# Patient Record
Sex: Male | Born: 1950
Health system: Southern US, Community
[De-identification: ages and names within clinical notes are randomized; demographics above are authoritative.]

## PROBLEM LIST (undated history)

## (undated) DIAGNOSIS — E785 Hyperlipidemia, unspecified: Secondary | ICD-10-CM

## (undated) DIAGNOSIS — R9439 Abnormal result of other cardiovascular function study: Secondary | ICD-10-CM

## (undated) DIAGNOSIS — Z720 Tobacco use: Secondary | ICD-10-CM

## (undated) DIAGNOSIS — I10 Essential (primary) hypertension: Secondary | ICD-10-CM

## (undated) DIAGNOSIS — I208 Other forms of angina pectoris: Secondary | ICD-10-CM

## (undated) DIAGNOSIS — I499 Cardiac arrhythmia, unspecified: Secondary | ICD-10-CM

## (undated) DIAGNOSIS — F17200 Nicotine dependence, unspecified, uncomplicated: Secondary | ICD-10-CM

## (undated) DIAGNOSIS — F32A Depression, unspecified: Secondary | ICD-10-CM

## (undated) DIAGNOSIS — I2510 Atherosclerotic heart disease of native coronary artery without angina pectoris: Secondary | ICD-10-CM

## (undated) DIAGNOSIS — F329 Major depressive disorder, single episode, unspecified: Secondary | ICD-10-CM

## (undated) DIAGNOSIS — M1990 Unspecified osteoarthritis, unspecified site: Secondary | ICD-10-CM

## (undated) HISTORY — PX: KNEE ARTHROSCOPY W/ DEBRIDEMENT: SHX1867

## (undated) HISTORY — PX: CERVICAL DISC SURGERY: SHX588

## (undated) HISTORY — PX: CARDIAC CATHETERIZATION: SHX172

---

## 2000-09-03 ENCOUNTER — Ambulatory Visit (HOSPITAL_COMMUNITY): Admission: RE | Admit: 2000-09-03 | Discharge: 2000-09-03 | Payer: Self-pay | Admitting: Family Medicine

## 2000-09-03 ENCOUNTER — Encounter: Payer: Self-pay | Admitting: Family Medicine

## 2002-08-22 ENCOUNTER — Encounter: Payer: Self-pay | Admitting: Family Medicine

## 2002-08-22 ENCOUNTER — Ambulatory Visit (HOSPITAL_COMMUNITY): Admission: RE | Admit: 2002-08-22 | Discharge: 2002-08-22 | Payer: Self-pay | Admitting: Family Medicine

## 2004-09-12 ENCOUNTER — Ambulatory Visit (HOSPITAL_COMMUNITY): Admission: RE | Admit: 2004-09-12 | Discharge: 2004-09-12 | Payer: Self-pay | Admitting: Family Medicine

## 2008-11-15 ENCOUNTER — Ambulatory Visit (HOSPITAL_COMMUNITY): Admission: RE | Admit: 2008-11-15 | Discharge: 2008-11-15 | Payer: Self-pay | Admitting: Family Medicine

## 2008-12-11 ENCOUNTER — Ambulatory Visit (HOSPITAL_COMMUNITY): Admission: RE | Admit: 2008-12-11 | Discharge: 2008-12-12 | Payer: Self-pay | Admitting: Neurosurgery

## 2010-05-02 ENCOUNTER — Other Ambulatory Visit (HOSPITAL_COMMUNITY): Payer: Self-pay | Admitting: Family Medicine

## 2010-05-02 DIAGNOSIS — M545 Low back pain: Secondary | ICD-10-CM

## 2010-05-06 ENCOUNTER — Ambulatory Visit (HOSPITAL_COMMUNITY)
Admission: RE | Admit: 2010-05-06 | Discharge: 2010-05-06 | Disposition: A | Discharge status: Home / Self Care | Payer: BC Managed Care – PPO | Source: Ambulatory Visit | Attending: Family Medicine | Admitting: Family Medicine

## 2010-05-06 DIAGNOSIS — M545 Low back pain, unspecified: Secondary | ICD-10-CM | POA: Insufficient documentation

## 2010-05-06 DIAGNOSIS — M5380 Other specified dorsopathies, site unspecified: Secondary | ICD-10-CM | POA: Insufficient documentation

## 2010-05-06 DIAGNOSIS — M79609 Pain in unspecified limb: Secondary | ICD-10-CM | POA: Insufficient documentation

## 2010-05-06 DIAGNOSIS — M5126 Other intervertebral disc displacement, lumbar region: Secondary | ICD-10-CM | POA: Insufficient documentation

## 2010-05-22 ENCOUNTER — Other Ambulatory Visit: Payer: Self-pay | Admitting: Neurosurgery

## 2010-05-22 DIAGNOSIS — M549 Dorsalgia, unspecified: Secondary | ICD-10-CM

## 2010-05-22 DIAGNOSIS — M5410 Radiculopathy, site unspecified: Secondary | ICD-10-CM

## 2010-05-26 ENCOUNTER — Ambulatory Visit
Admission: RE | Admit: 2010-05-26 | Discharge: 2010-05-26 | Disposition: A | Discharge status: Home / Self Care | Payer: BC Managed Care – PPO | Source: Ambulatory Visit | Attending: Neurosurgery | Admitting: Neurosurgery

## 2010-05-26 ENCOUNTER — Other Ambulatory Visit: Payer: Self-pay | Admitting: Neurosurgery

## 2010-05-26 DIAGNOSIS — M549 Dorsalgia, unspecified: Secondary | ICD-10-CM

## 2010-05-26 DIAGNOSIS — M5410 Radiculopathy, site unspecified: Secondary | ICD-10-CM

## 2010-06-05 LAB — CBC
HCT: 46.9 % (ref 39.0–52.0)
Hemoglobin: 16 g/dL (ref 13.0–17.0)
MCHC: 34.1 g/dL (ref 30.0–36.0)
MCV: 91.3 fL (ref 78.0–100.0)
Platelets: 250 10*3/uL (ref 150–400)
RBC: 5.13 MIL/uL (ref 4.22–5.81)
RDW: 14 % (ref 11.5–15.5)
WBC: 11.3 10*3/uL — ABNORMAL HIGH (ref 4.0–10.5)

## 2010-06-05 LAB — BASIC METABOLIC PANEL
BUN: 12 mg/dL (ref 6–23)
CO2: 27 mEq/L (ref 19–32)
Calcium: 9.9 mg/dL (ref 8.4–10.5)
Chloride: 108 mEq/L (ref 96–112)
Creatinine, Ser: 0.8 mg/dL (ref 0.4–1.5)
GFR calc Af Amer: >60 mL/min (ref >60)
GFR calc non Af Amer: >60 mL/min (ref >60)
Glucose, Bld: 82 mg/dL (ref 70–99)
Potassium: 4.5 mEq/L (ref 3.5–5.1)
Sodium: 142 mEq/L (ref 135–145)

## 2010-06-16 ENCOUNTER — Other Ambulatory Visit: Payer: Self-pay | Admitting: Neurosurgery

## 2010-06-16 ENCOUNTER — Ambulatory Visit
Admission: RE | Admit: 2010-06-16 | Discharge: 2010-06-16 | Disposition: A | Discharge status: Home / Self Care | Payer: BC Managed Care – PPO | Source: Ambulatory Visit | Attending: Neurosurgery | Admitting: Neurosurgery

## 2010-06-16 DIAGNOSIS — M5410 Radiculopathy, site unspecified: Secondary | ICD-10-CM

## 2010-06-16 DIAGNOSIS — M549 Dorsalgia, unspecified: Secondary | ICD-10-CM

## 2011-05-21 ENCOUNTER — Other Ambulatory Visit: Payer: Self-pay | Admitting: Cardiovascular Disease

## 2011-05-21 ENCOUNTER — Other Ambulatory Visit (HOSPITAL_COMMUNITY): Payer: Self-pay | Admitting: Cardiovascular Disease

## 2011-05-21 ENCOUNTER — Encounter (HOSPITAL_COMMUNITY): Payer: Self-pay | Admitting: Pharmacy Technician

## 2011-05-21 ENCOUNTER — Ambulatory Visit (HOSPITAL_COMMUNITY)
Admission: RE | Admit: 2011-05-21 | Discharge: 2011-05-21 | Disposition: A | Discharge status: Home / Self Care | Payer: Medicaid Other | Source: Ambulatory Visit | Attending: Cardiovascular Disease | Admitting: Cardiovascular Disease

## 2011-05-21 DIAGNOSIS — R9439 Abnormal result of other cardiovascular function study: Secondary | ICD-10-CM | POA: Insufficient documentation

## 2011-05-21 DIAGNOSIS — Z981 Arthrodesis status: Secondary | ICD-10-CM | POA: Insufficient documentation

## 2011-05-21 DIAGNOSIS — Z01811 Encounter for preprocedural respiratory examination: Secondary | ICD-10-CM

## 2011-05-26 ENCOUNTER — Encounter (HOSPITAL_COMMUNITY): Payer: Self-pay | Admitting: Internal Medicine

## 2011-05-26 ENCOUNTER — Encounter (HOSPITAL_COMMUNITY)
Admission: RE | Disposition: A | Discharge status: Home / Self Care | Payer: Self-pay | Source: Ambulatory Visit | Attending: Thoracic Surgery (Cardiothoracic Vascular Surgery)

## 2011-05-26 ENCOUNTER — Other Ambulatory Visit: Payer: Self-pay

## 2011-05-26 ENCOUNTER — Ambulatory Visit (HOSPITAL_COMMUNITY): Payer: Medicaid Other

## 2011-05-26 ENCOUNTER — Encounter (HOSPITAL_COMMUNITY): Payer: Self-pay | Admitting: Anesthesiology

## 2011-05-26 ENCOUNTER — Inpatient Hospital Stay (HOSPITAL_COMMUNITY)
Admission: RE | Admit: 2011-05-26 | Discharge: 2011-05-30 | DRG: 234 | Disposition: A | Discharge status: Home / Self Care | Payer: Medicaid Other | Source: Ambulatory Visit | Attending: Thoracic Surgery (Cardiothoracic Vascular Surgery) | Admitting: Thoracic Surgery (Cardiothoracic Vascular Surgery)

## 2011-05-26 DIAGNOSIS — Z720 Tobacco use: Secondary | ICD-10-CM | POA: Diagnosis present

## 2011-05-26 DIAGNOSIS — R9439 Abnormal result of other cardiovascular function study: Secondary | ICD-10-CM | POA: Diagnosis present

## 2011-05-26 DIAGNOSIS — I10 Essential (primary) hypertension: Secondary | ICD-10-CM | POA: Diagnosis present

## 2011-05-26 DIAGNOSIS — I2510 Atherosclerotic heart disease of native coronary artery without angina pectoris: Secondary | ICD-10-CM

## 2011-05-26 DIAGNOSIS — Z01810 Encounter for preprocedural cardiovascular examination: Secondary | ICD-10-CM

## 2011-05-26 DIAGNOSIS — E785 Hyperlipidemia, unspecified: Secondary | ICD-10-CM | POA: Diagnosis present

## 2011-05-26 DIAGNOSIS — I2089 Other forms of angina pectoris: Secondary | ICD-10-CM | POA: Diagnosis present

## 2011-05-26 DIAGNOSIS — M5137 Other intervertebral disc degeneration, lumbosacral region: Secondary | ICD-10-CM | POA: Diagnosis present

## 2011-05-26 DIAGNOSIS — M129 Arthropathy, unspecified: Secondary | ICD-10-CM | POA: Diagnosis present

## 2011-05-26 DIAGNOSIS — I208 Other forms of angina pectoris: Secondary | ICD-10-CM | POA: Diagnosis present

## 2011-05-26 DIAGNOSIS — M51379 Other intervertebral disc degeneration, lumbosacral region without mention of lumbar back pain or lower extremity pain: Secondary | ICD-10-CM | POA: Diagnosis present

## 2011-05-26 DIAGNOSIS — Z23 Encounter for immunization: Secondary | ICD-10-CM

## 2011-05-26 DIAGNOSIS — Z7982 Long term (current) use of aspirin: Secondary | ICD-10-CM

## 2011-05-26 DIAGNOSIS — F17200 Nicotine dependence, unspecified, uncomplicated: Secondary | ICD-10-CM | POA: Diagnosis present

## 2011-05-26 HISTORY — DX: Atherosclerotic heart disease of native coronary artery without angina pectoris: I25.10

## 2011-05-26 HISTORY — DX: Essential (primary) hypertension: I10

## 2011-05-26 HISTORY — DX: Tobacco use: Z72.0

## 2011-05-26 HISTORY — DX: Unspecified osteoarthritis, unspecified site: M19.90

## 2011-05-26 HISTORY — DX: Abnormal result of other cardiovascular function study: R94.39

## 2011-05-26 HISTORY — DX: Hyperlipidemia, unspecified: E78.5

## 2011-05-26 HISTORY — PX: LEFT HEART CATHETERIZATION WITH CORONARY ANGIOGRAM: SHX5451

## 2011-05-26 HISTORY — DX: Other forms of angina pectoris: I20.8

## 2011-05-26 HISTORY — DX: Nicotine dependence, unspecified, uncomplicated: F17.200

## 2011-05-26 LAB — BLOOD GAS, ARTERIAL
Acid-Base Excess: 1 mmol/L (ref 0.0–2.0)
Bicarbonate: 24.9 mEq/L — ABNORMAL HIGH (ref 20.0–24.0)
Drawn by: 24513
FIO2: 21 %
O2 Saturation: 95.5 %
Patient temperature: 98.6
TCO2: 26.1 mmol/L (ref 0–100)
pCO2 arterial: 39 mmHg (ref 35.0–45.0)
pH, Arterial: 7.422 (ref 7.350–7.450)
pO2, Arterial: 77.5 mmHg — ABNORMAL LOW (ref 80.0–100.0)

## 2011-05-26 LAB — URINALYSIS, ROUTINE W REFLEX MICROSCOPIC
Bilirubin Urine: NEGATIVE
Glucose, UA: NEGATIVE mg/dL
Hgb urine dipstick: NEGATIVE
Ketones, ur: NEGATIVE mg/dL
Leukocytes, UA: NEGATIVE
Nitrite: NEGATIVE
Protein, ur: NEGATIVE mg/dL
Specific Gravity, Urine: 1.04 — ABNORMAL HIGH (ref 1.005–1.030)
Urobilinogen, UA: 1 mg/dL (ref 0.0–1.0)
pH: 6 (ref 5.0–8.0)

## 2011-05-26 LAB — COMPREHENSIVE METABOLIC PANEL
ALT: 22 U/L (ref 0–53)
AST: 19 U/L (ref 0–37)
Albumin: 3.4 g/dL — ABNORMAL LOW (ref 3.5–5.2)
Alkaline Phosphatase: 75 U/L (ref 39–117)
BUN: 16 mg/dL (ref 6–23)
CO2: 27 mEq/L (ref 19–32)
Calcium: 9.3 mg/dL (ref 8.4–10.5)
Chloride: 104 mEq/L (ref 96–112)
Creatinine, Ser: 1.03 mg/dL (ref 0.50–1.35)
GFR calc Af Amer: 89 mL/min — ABNORMAL LOW (ref >90)
GFR calc non Af Amer: 77 mL/min — ABNORMAL LOW (ref >90)
Glucose, Bld: 91 mg/dL (ref 70–99)
Potassium: 4.4 mEq/L (ref 3.5–5.1)
Sodium: 140 mEq/L (ref 135–145)
Total Bilirubin: 0.3 mg/dL (ref 0.3–1.2)
Total Protein: 6.4 g/dL (ref 6.0–8.3)

## 2011-05-26 LAB — CBC
HCT: 44.5 % (ref 39.0–52.0)
Hemoglobin: 15.4 g/dL (ref 13.0–17.0)
MCH: 29.8 pg (ref 26.0–34.0)
MCHC: 34.6 g/dL (ref 30.0–36.0)
MCV: 86.1 fL (ref 78.0–100.0)
Platelets: 226 10*3/uL (ref 150–400)
RBC: 5.17 MIL/uL (ref 4.22–5.81)
RDW: 14 % (ref 11.5–15.5)
WBC: 10.9 10*3/uL — ABNORMAL HIGH (ref 4.0–10.5)

## 2011-05-26 LAB — TYPE AND SCREEN
ABO/RH(D): O POS
Antibody Screen: NEGATIVE

## 2011-05-26 LAB — PULMONARY FUNCTION TEST

## 2011-05-26 LAB — PROTIME-INR
INR: 1.02 (ref 0.00–1.49)
Prothrombin Time: 13.6 seconds (ref 11.6–15.2)

## 2011-05-26 LAB — APTT: aPTT: 32 seconds (ref 24–37)

## 2011-05-26 LAB — SURGICAL PCR SCREEN
MRSA, PCR: NEGATIVE
Staphylococcus aureus: NEGATIVE

## 2011-05-26 LAB — ABO/RH: ABO/RH(D): O POS

## 2011-05-26 SURGERY — LEFT HEART CATHETERIZATION WITH CORONARY ANGIOGRAM
Anesthesia: LOCAL

## 2011-05-26 MED ORDER — VERAPAMIL HCL 2.5 MG/ML IV SOLN
INTRAVENOUS | Status: AC
  Administered 2011-05-27: 11:00:00
  Filled 2011-05-26 (×3): qty 2.5

## 2011-05-26 MED ORDER — CHLORHEXIDINE GLUCONATE 4 % EX LIQD
60.0000 mL | Freq: Once | CUTANEOUS | Status: AC
  Administered 2011-05-26: 4 via TOPICAL
  Filled 2011-05-26: qty 60

## 2011-05-26 MED ORDER — OXYCODONE-ACETAMINOPHEN 10-325 MG PO TABS: 1.0000 | ORAL_TABLET | Freq: Every day | ORAL | Status: DC

## 2011-05-26 MED ORDER — SODIUM CHLORIDE 0.9 % IJ SOLN: 3.0000 mL | INTRAMUSCULAR | Status: DC | PRN

## 2011-05-26 MED ORDER — SODIUM CHLORIDE 0.9 % IV SOLN
INTRAVENOUS | Status: AC
  Administered 2011-05-27: .8 [IU]/h via INTRAVENOUS
  Filled 2011-05-26: qty 1

## 2011-05-26 MED ORDER — VANCOMYCIN HCL 1000 MG IV SOLR
1500.0000 mg | INTRAVENOUS | Status: AC
  Administered 2011-05-27: 1500 mg via INTRAVENOUS
  Filled 2011-05-26: qty 1500

## 2011-05-26 MED ORDER — DIAZEPAM 5 MG PO TABS
5.0000 mg | ORAL_TABLET | Freq: Once | ORAL | Status: AC
  Administered 2011-05-27: 5 mg via ORAL
  Filled 2011-05-26: qty 1

## 2011-05-26 MED ORDER — ONDANSETRON HCL 4 MG/2ML IJ SOLN: 4.0000 mg | Freq: Four times a day (QID) | INTRAMUSCULAR | Status: DC | PRN

## 2011-05-26 MED ORDER — CHLORHEXIDINE GLUCONATE 4 % EX LIQD
60.0000 mL | Freq: Once | CUTANEOUS | Status: AC
  Administered 2011-05-27: 4 via TOPICAL

## 2011-05-26 MED ORDER — EPINEPHRINE HCL 1 MG/ML IJ SOLN
0.5000 ug/min | INTRAVENOUS | Status: DC
  Filled 2011-05-26: qty 4

## 2011-05-26 MED ORDER — SODIUM CHLORIDE 0.9 % IV SOLN: 1.0000 mL/kg/h | INTRAVENOUS | Status: AC

## 2011-05-26 MED ORDER — DEXMEDETOMIDINE HCL 100 MCG/ML IV SOLN
0.1000 ug/kg/h | INTRAVENOUS | Status: AC
  Administered 2011-05-27: .2 ug/kg/h via INTRAVENOUS
  Filled 2011-05-26: qty 4

## 2011-05-26 MED ORDER — MAGNESIUM SULFATE 50 % IJ SOLN
40.0000 meq | INTRAMUSCULAR | Status: DC
  Filled 2011-05-26: qty 10

## 2011-05-26 MED ORDER — SODIUM CHLORIDE 0.9 % IV SOLN
INTRAVENOUS | Status: DC
  Filled 2011-05-26: qty 40

## 2011-05-26 MED ORDER — FENTANYL CITRATE 0.05 MG/ML IJ SOLN
INTRAMUSCULAR | Status: AC
  Filled 2011-05-26: qty 2

## 2011-05-26 MED ORDER — POTASSIUM CHLORIDE 2 MEQ/ML IV SOLN
80.0000 meq | INTRAVENOUS | Status: DC
  Filled 2011-05-26: qty 40

## 2011-05-26 MED ORDER — NITROGLYCERIN 0.2 MG/ML ON CALL CATH LAB
INTRAVENOUS | Status: AC
  Filled 2011-05-26: qty 1

## 2011-05-26 MED ORDER — BISACODYL 5 MG PO TBEC
5.0000 mg | DELAYED_RELEASE_TABLET | Freq: Once | ORAL | Status: DC
  Filled 2011-05-26: qty 1

## 2011-05-26 MED ORDER — PHENYLEPHRINE HCL 10 MG/ML IJ SOLN
30.0000 ug/min | INTRAVENOUS | Status: DC
  Filled 2011-05-26: qty 2

## 2011-05-26 MED ORDER — SIMVASTATIN 40 MG PO TABS
40.0000 mg | ORAL_TABLET | Freq: Every evening | ORAL | Status: DC
  Administered 2011-05-26: 40 mg via ORAL
  Filled 2011-05-26 (×2): qty 1

## 2011-05-26 MED ORDER — TEMAZEPAM 15 MG PO CAPS: 15.0000 mg | ORAL_CAPSULE | Freq: Once | ORAL | Status: AC | PRN

## 2011-05-26 MED ORDER — HEPARIN (PORCINE) IN NACL 2-0.9 UNIT/ML-% IJ SOLN
INTRAMUSCULAR | Status: AC
  Filled 2011-05-26: qty 2000

## 2011-05-26 MED ORDER — MOXIFLOXACIN HCL IN NACL 400 MG/250ML IV SOLN
400.0000 mg | INTRAVENOUS | Status: AC
  Administered 2011-05-27: 400 mg via INTRAVENOUS
  Filled 2011-05-26: qty 250

## 2011-05-26 MED ORDER — LIDOCAINE HCL (PF) 1 % IJ SOLN
INTRAMUSCULAR | Status: AC
  Filled 2011-05-26: qty 30

## 2011-05-26 MED ORDER — NITROGLYCERIN IN D5W 200-5 MCG/ML-% IV SOLN
2.0000 ug/min | INTRAVENOUS | Status: AC
  Administered 2011-05-27: 10 ug/min via INTRAVENOUS
  Filled 2011-05-26: qty 250

## 2011-05-26 MED ORDER — SODIUM CHLORIDE 0.9 % IJ SOLN: 3.0000 mL | Freq: Two times a day (BID) | INTRAMUSCULAR | Status: DC

## 2011-05-26 MED ORDER — METOPROLOL SUCCINATE ER 50 MG PO TB24
50.0000 mg | ORAL_TABLET | Freq: Every day | ORAL | Status: DC
  Filled 2011-05-26: qty 1

## 2011-05-26 MED ORDER — METOPROLOL TARTRATE 12.5 MG HALF TABLET
12.5000 mg | ORAL_TABLET | Freq: Once | ORAL | Status: AC
  Administered 2011-05-27: 12.5 mg via ORAL
  Filled 2011-05-26: qty 1

## 2011-05-26 MED ORDER — ALPRAZOLAM 0.25 MG PO TABS
0.2500 mg | ORAL_TABLET | ORAL | Status: DC | PRN
  Administered 2011-05-26: 0.5 mg via ORAL
  Filled 2011-05-26: qty 2

## 2011-05-26 MED ORDER — ASPIRIN 81 MG PO CHEW
324.0000 mg | CHEWABLE_TABLET | ORAL | Status: AC
  Administered 2011-05-26: 324 mg via ORAL
  Filled 2011-05-26: qty 4

## 2011-05-26 MED ORDER — CHLORHEXIDINE GLUCONATE 4 % EX LIQD
60.0000 mL | Freq: Once | CUTANEOUS | Status: AC
  Administered 2011-05-26: 4 via TOPICAL

## 2011-05-26 MED ORDER — DOPAMINE-DEXTROSE 3.2-5 MG/ML-% IV SOLN
2.0000 ug/kg/min | INTRAVENOUS | Status: DC
  Filled 2011-05-26: qty 250

## 2011-05-26 MED ORDER — RAMIPRIL 5 MG PO CAPS
5.0000 mg | ORAL_CAPSULE | Freq: Every day | ORAL | Status: DC
  Filled 2011-05-26: qty 1

## 2011-05-26 MED ORDER — PNEUMOCOCCAL VAC POLYVALENT 25 MCG/0.5ML IJ INJ
0.5000 mL | INJECTION | INTRAMUSCULAR | Status: DC
  Filled 2011-05-26: qty 0.5

## 2011-05-26 MED ORDER — ACETAMINOPHEN 325 MG PO TABS: 650.0000 mg | ORAL_TABLET | ORAL | Status: DC | PRN

## 2011-05-26 MED ORDER — OXYCODONE-ACETAMINOPHEN 5-325 MG PO TABS: 2.0000 | ORAL_TABLET | Freq: Every day | ORAL | Status: DC

## 2011-05-26 MED ORDER — SODIUM CHLORIDE 0.9 % IV SOLN: 250.0000 mL | INTRAVENOUS | Status: DC | PRN

## 2011-05-26 MED ORDER — MIDAZOLAM HCL 2 MG/2ML IJ SOLN
INTRAMUSCULAR | Status: AC
  Filled 2011-05-26: qty 2

## 2011-05-26 MED ORDER — ASPIRIN EC 81 MG PO TBEC
162.0000 mg | DELAYED_RELEASE_TABLET | Freq: Every day | ORAL | Status: DC
  Filled 2011-05-26: qty 2

## 2011-05-26 MED ORDER — PANTOPRAZOLE SODIUM 40 MG PO TBEC: 40.0000 mg | DELAYED_RELEASE_TABLET | Freq: Every day | ORAL | Status: DC

## 2011-05-26 MED ORDER — DEXTROSE-NACL 5-0.45 % IV SOLN: INTRAVENOUS | Status: DC

## 2011-05-26 MED ORDER — SODIUM CHLORIDE 0.9 % IV SOLN
INTRAVENOUS | Status: DC
  Administered 2011-05-26: 08:00:00 via INTRAVENOUS

## 2011-05-26 MED ORDER — NITROGLYCERIN 0.4 MG SL SUBL: 0.4000 mg | SUBLINGUAL_TABLET | SUBLINGUAL | Status: DC | PRN

## 2011-05-26 NOTE — H&P (Signed)
     [facility]          INTERVAL PROCEDURE H&P   History and Physical Interval Note:  05/26/2011 7:59 AM  [name] has presented today for their planned procedure. The various methods of treatment have been discussed with the patient and family. After consideration of risks, benefits and other options for treatment, the patient has consented to the procedure.  The patients' outpatient history has been reviewed, patient examined, and no change in status from most recent office note within the past 30 days. I have reviewed the patients' chart and labs and will proceed as planned. Questions were answered to the patient's satisfaction.   Chrystie Nose, MD, [facility] Attending Cardiologist [facility]  Teodor Prater C 05/26/2011, 7:59 AM

## 2011-05-26 NOTE — Progress Notes (Signed)
Pre-op Cardiac Surgery  Carotid Findings:  Bilaterally no significant ICA stenosis with antegrade vertebral flow.  Upper Extremity Right Left  Brachial Pressures 149 142  Radial Waveforms Tri Tri  Ulnar Waveforms Tri Tri  Palmar Arch (Allen's Test) Waveform remains normal with radial compression and decreases >50% with ulnar compression Waveform remains normal with ulnar compression and decreases >50% with radial compression    BILATERALLY PALPABLE PEDAL PULSES    Farrel Demark , RDMS

## 2011-05-26 NOTE — Op Note (Signed)
[facility]     CARDIAC CATHETERIZATION REPORT  [name]   [id number] Aug 23, 1950  Performing Cardiologist: Chrystie Nose Primary Physician: Kirk Ruths, MD, MD Primary Cardiologist:  Alanda Amass  Procedures Performed:  Left Heart Catheterization via 5 Fr right radial artery access  Left Ventriculography, (RAO/LAO) 15 ml/sec for 30 ml total contrast  Native Coronary Angiography  Indication(s): Chest pain, abnormal nuclear stress test  History: 61 y.o. male with a history of tobacco abuse, dyslipidemia, and chronic low back pain. He has been non-compliant with statins. He is non-diabetic. He was seen by Dr. Alanda Amass for chest pain. He underwent a nuclear stress test in our office on 05/19/2011 which demonstrated a moderate-sized anterior perfusion defect which was fixed with moderate peri-infarct ischemia. This was felt to be high risk. He had an echocardiogram which showed an LVEF of 55%, mild aortic sclerosis and MAC.  No reported diastolic dysfunction. He is referred for cardiac catheterization.  Consent: The procedure with Risks/Benefits/Alternatives and Indications was reviewed with the patient and family.  All questions were answered.    Risks / Complications include, but not limited to: Death, MI, CVA/TIA, VF/VT (with defibrillation), Bradycardia (need for temporary pacer placement), contrast induced nephropathy, bleeding / bruising / hematoma / pseudoaneurysm, vascular or coronary injury (with possible emergent CT or Vascular Surgery), adverse medication reactions, infection.    The patient voices understanding and agree to proceed.    Risks of procedure as well as the alternatives and risks of each were explained to the (patient/caregiver).  Consent for procedure obtained. Consent for signed by MD and patient with RN witness -- placed on chart.  Procedure: The patient was brought to the 2nd Floor [facility] in  the fasting state and prepped and draped in the usual sterile fashion for (Right radial) access. A modified Allen's test with plethysmography was performed on the right wrist demonstrating adequate Ulnar Artery collateral flow).    Sterile technique was used including antiseptics, cap, gloves, gown, hand hygiene, mask and sheet.  Skin prep: Chlorhexidine;  Time Out: Verified patient identification, verified procedure, site/side was marked, verified correct patient position, special equipment/implants available, medications/allergies/relevent history reviewed, required imaging and test results available.  Performed  The right wrist was anesthetized with 1% subcutaneous Lidocaine.  The right radial artery was accessed using the Seldinger Technique with placement of a 5 Fr Glide Sheath. The sheath was aspirated and flushed.  Then a total of 10 ml of standard Radial Artery Cocktail (see medications) was infused. A 5 Fr TIG 4.0 Catheter was advanced of over a Safety J wire into the ascending Aorta.  The catheter was used to engage the left coronary artery.  Multiple cineangiographic views of the left coronary artery system(s) were performed. A 5 Fr Williams Right Catheter was advanced of over a Exchange Safety J wire into the ascending Aorta.  The catheter was used to non-selectively engage the right coronary artery.  Multiple cineangiographic views of the right coronary artery system(s) were performed. This catheter was then exchanged over the Long Exchange Safety J wire for an angled Pigtail catheter that was advanced across the Aortic Valve.  LV hemodynamics were measured and Left Ventriculography was performed.  LV hemodynamics were then re-sampled, and the catheter was pulled back across the Aortic Valve for measurement of "pull-back" gradient.  The catheter and the wire was removed completely out of the body.  The sheath was removed in the Cath Lab with a TR band  placed - 17 cc air at 0955.Marland Kitchen  Reverse  Allen's test revealed non-occlusive hemostasis.  The patient was transported to the cath lab holding area in stable condition.   The patient  was stable before, during and following the procedure.   Patient did tolerate procedure well. There were not complications.  EBL: <10 cc  Medications:  Premedication: 5 mg  Valium  Sedation:  2 mg IV Versed, 50 IV mcg Fentanyl  Contrast:  80 cc Omnipaque  5000 IU heparin, 10 cc radial cocktail  Hemodynamics:  Central Aortic Pressure / Mean Aortic Pressure: 120/76  LV Pressure / LV End diastolic Pressure:  20  Left Ventriculography:  EF:  60-65%  Wall Motion: No focal wall motion abnormalities  Coronary Angiographic Data:  Left Main:  Short left main. Distal 20% stenosis into the ostial LAD.  Left Anterior Descending (LAD):  There is a 90% ostial discrete lesion of the LAD. There is mild distal LAD disease.  1st diagonal (D1):  Large vessel without significant stenosis.  Circumflex (LCx):  Dominant vessel. There is a moderate stenosis of the LCX up to 50% distal to OM3.  1st obtuse marginal:  Small vessel, high takeoff. No significant disease.  2nd obtuse marginal: Small vessel, no significant disease 3rd obtuse marginal:  Large vessel which branches into 2 vessels supplying a good portion of the lateral wall. No significant stenoses.   posterior lateral branch:  Supplies the septum.  Right Coronary Artery: Small to moderate-sized non-dominant vessel, no significant stenoses.  Impression: 1.  High risk ostial LAD discrete stenosis (90%). 2.  Moderate mid-distal LCx stenosis. Left dominant system. 3.  LVEF >60%, no focal wall motion abnormalities.  Plan: 1.  Discussed case with Dr. Allyson Sabal and Dr. Alanda Amass. Stent would be high risk. Will ask CT surgery to evaluate for possible OPCABG. 2.  Will admit due to high risk anatomy with hopefully surgical intervention in the next few days. He has not been on dual antiplatelets.  The case  and results was discussed with the patient and family. The case and results was not discussed with the patient's PCP. The case and results was discussed with the patient's Cardiologist.  Time Spend Directly with Patient:  45 minutes  Chrystie Nose, MD, [facility] Attending Cardiologist [facility]  Shirlean Berman C 05/26/2011, 10:26 AM

## 2011-05-26 NOTE — Consult Note (Signed)
Reason for Consult:Severe ostial LAD disease Referring Physician: Dr. Hinda Lenis [name] is an 61 y.o. male.  HPI:60 yo WM with no prior history of CAD. First noted chest discomfort near the end of last summer after mowing his yard. Lasted minutes and resolved with rest. Did well until a couple of weeks ago when he again noted exertional chest discomfort. He describes this as an aching/ pressure type sensation. He had a stress test which showed a large area of the anteroapical wall and septum at risk. Cardiac cath today revealed critical stenosis at the ostium of the LAD. He did not have any other hemodynamically significant disease.  Past Medical History  Diagnosis Date  . Chest pain   . Coronary artery disease   . Hypertension   . Arthritis     Past Surgical History  Procedure Date  . Knee arthroscopy w/ debridement   . Cervical disc surgery     History reviewed. No pertinent family history.  Social History:  reports that he has been smoking Cigarettes.  He has a 35 pack-year smoking history. He has never used smokeless tobacco. He reports that he does not drink alcohol or use illicit drugs.  Allergies:  Allergies  Allergen Reactions  . Penicillins Other (See Comments)    unknown    Medications:  I have reviewed the patient's current medications. Prior to Admission:  Prescriptions prior to admission  Medication Sig Dispense Refill  . aspirin EC 81 MG tablet Take 162 mg by mouth daily.      . metoprolol succinate (TOPROL-XL) 50 MG 24 hr tablet Take 50 mg by mouth daily. Take with or immediately following a meal.      . Omega-3 Fatty Acids (FISH OIL PO) Take 1 capsule by mouth 3 (three) times daily.      Marland Kitchen oxyCODONE-acetaminophen (PERCOCET) 10-325 MG per tablet Take 1 tablet by mouth daily.      . pantoprazole (PROTONIX) 40 MG tablet Take 40 mg by mouth daily.      . ramipril (ALTACE) 5 MG capsule Take 5 mg by mouth daily.      . simvastatin (ZOCOR) 40 MG tablet Take  40 mg by mouth every evening.      . nitroGLYCERIN (NITROSTAT) 0.4 MG SL tablet Place 0.4 mg under the tongue every 5 (five) minutes as needed. For chest pain        Results for orders placed during the hospital encounter of 05/26/11 (from the past 48 hour(s))  PROTIME-INR     Status: Normal   Collection Time   05/26/11  7:12 AM      Component Value Range Comment   Prothrombin Time 13.6  11.6 - 15.2 (seconds)    INR 1.02  0.00 - 1.49      No results found.  Review of Systems  Constitutional: Negative for fever, chills, weight loss and malaise/fatigue.  Respiratory: Negative for shortness of breath and wheezing.   Cardiovascular: Positive for chest pain. Negative for claudication, leg swelling and PND.  Gastrointestinal: Negative.   Genitourinary: Negative.   Neurological: Positive for sensory change, focal weakness (right leg numbness and weakness, walks with cane) and weakness. Negative for speech change.  Endo/Heme/Allergies: Does not bruise/bleed easily.  All other systems reviewed and are negative.   Blood pressure 162/84, pulse 56, temperature 97.6 F (36.4 C), temperature source Oral, resp. rate 18, height 5\' 4"  (1.626 m), weight 216 lb (97.977 kg), SpO2 97.00%. Physical Exam  Vitals reviewed. Constitutional: He  is oriented to person, place, and time. He appears well-developed and well-nourished.  HENT:  Head: Normocephalic and atraumatic.  Eyes: EOM are normal. Pupils are equal, round, and reactive to light.  Neck: Neck supple. No JVD present. No thyromegaly present.  Cardiovascular: Normal rate, regular rhythm, normal heart sounds and intact distal pulses.  Exam reveals no gallop and no friction rub.   No murmur heard. Respiratory: Effort normal and breath sounds normal. He has no wheezes. He has no rales.  GI: Soft. There is no tenderness.  Musculoskeletal: He exhibits no edema.  Lymphadenopathy:    He has no cervical adenopathy.  Neurological: He is alert and  oriented to person, place, and time. No cranial nerve deficit.  Skin: Skin is warm and dry.    Assessment/Plan: 61 yo WM with exertional again and ostial LAD disease not amenable to percutaneous intervention. CABG x 1 with LIMA to LAD indicated for relief of symptoms and myocardial preservation. He is a good candidate for off pump grafting.  I have discussed with the patient the general nature of the procedure, need for general anesthesia,and incisions to be used. I have discussed the expected hospital stay, overall recovery and short and long term outcomes. He understands the risks include but are not limited to death, stroke, MI, DVT/PE, bleeding, possible need for transfusion, infections, other organ system dysfunction including respiratory, renal, or GI complications. He understands that CABG is palliative and not a guarantee against possible future cardiac events. He understands and accepts the risks of the procedure and agrees to proceed.   For OPCAB x 1 in AM 3/27  [name] C 05/26/2011, 6:07 PM

## 2011-05-27 ENCOUNTER — Other Ambulatory Visit: Payer: Self-pay

## 2011-05-27 ENCOUNTER — Encounter (HOSPITAL_COMMUNITY): Payer: Self-pay | Admitting: Anesthesiology

## 2011-05-27 ENCOUNTER — Ambulatory Visit (HOSPITAL_COMMUNITY): Payer: Medicaid Other | Admitting: Anesthesiology

## 2011-05-27 ENCOUNTER — Inpatient Hospital Stay (HOSPITAL_COMMUNITY): Payer: Medicaid Other

## 2011-05-27 ENCOUNTER — Encounter (HOSPITAL_COMMUNITY)
Admission: RE | Disposition: A | Discharge status: Home / Self Care | Payer: Self-pay | Source: Ambulatory Visit | Attending: Thoracic Surgery (Cardiothoracic Vascular Surgery)

## 2011-05-27 DIAGNOSIS — I2510 Atherosclerotic heart disease of native coronary artery without angina pectoris: Secondary | ICD-10-CM

## 2011-05-27 HISTORY — PX: CORONARY ARTERY BYPASS GRAFT: SHX141

## 2011-05-27 LAB — POCT I-STAT 4, (NA,K, GLUC, HGB,HCT)
Glucose, Bld: 88 mg/dL (ref 70–99)
Glucose, Bld: 96 mg/dL (ref 70–99)
Glucose, Bld: 108 mg/dL — ABNORMAL HIGH (ref 70–99)
Glucose, Bld: 108 mg/dL — ABNORMAL HIGH (ref 70–99)
HCT: 40 % (ref 39.0–52.0)
HCT: 37 % — ABNORMAL LOW (ref 39.0–52.0)
HCT: 38 % — ABNORMAL LOW (ref 39.0–52.0)
HCT: 37 % — ABNORMAL LOW (ref 39.0–52.0)
Hemoglobin: 13.6 g/dL (ref 13.0–17.0)
Hemoglobin: 12.6 g/dL — ABNORMAL LOW (ref 13.0–17.0)
Hemoglobin: 12.9 g/dL — ABNORMAL LOW (ref 13.0–17.0)
Hemoglobin: 12.6 g/dL — ABNORMAL LOW (ref 13.0–17.0)
Potassium: 4.2 mEq/L (ref 3.5–5.1)
Potassium: 4.1 mEq/L (ref 3.5–5.1)
Potassium: 4 mEq/L (ref 3.5–5.1)
Potassium: 3.8 mEq/L (ref 3.5–5.1)
Sodium: 141 mEq/L (ref 135–145)
Sodium: 140 mEq/L (ref 135–145)
Sodium: 140 mEq/L (ref 135–145)
Sodium: 141 mEq/L (ref 135–145)

## 2011-05-27 LAB — CBC
HCT: 37.4 % — ABNORMAL LOW (ref 39.0–52.0)
HCT: 38.5 % — ABNORMAL LOW (ref 39.0–52.0)
Hemoglobin: 13 g/dL (ref 13.0–17.0)
Hemoglobin: 13.2 g/dL (ref 13.0–17.0)
MCH: 29.8 pg (ref 26.0–34.0)
MCH: 29.1 pg (ref 26.0–34.0)
MCHC: 34.8 g/dL (ref 30.0–36.0)
MCHC: 34.3 g/dL (ref 30.0–36.0)
MCV: 85.8 fL (ref 78.0–100.0)
MCV: 85 fL (ref 78.0–100.0)
Platelets: 176 10*3/uL (ref 150–400)
Platelets: 181 10*3/uL (ref 150–400)
RBC: 4.36 MIL/uL (ref 4.22–5.81)
RBC: 4.53 MIL/uL (ref 4.22–5.81)
RDW: 13.9 % (ref 11.5–15.5)
RDW: 13.8 % (ref 11.5–15.5)
WBC: 9.1 10*3/uL (ref 4.0–10.5)
WBC: 12.8 10*3/uL — ABNORMAL HIGH (ref 4.0–10.5)

## 2011-05-27 LAB — POCT I-STAT, CHEM 8
BUN: 13 mg/dL (ref 6–23)
Calcium, Ion: 1.21 mmol/L (ref 1.12–1.32)
Chloride: 108 mEq/L (ref 96–112)
Creatinine, Ser: 0.7 mg/dL (ref 0.50–1.35)
Glucose, Bld: 108 mg/dL — ABNORMAL HIGH (ref 70–99)
HCT: 40 % (ref 39.0–52.0)
Hemoglobin: 13.6 g/dL (ref 13.0–17.0)
Potassium: 4.8 mEq/L (ref 3.5–5.1)
Sodium: 140 mEq/L (ref 135–145)
TCO2: 23 mmol/L (ref 0–100)

## 2011-05-27 LAB — GLUCOSE, CAPILLARY
Glucose-Capillary: 101 mg/dL — ABNORMAL HIGH (ref 70–99)
Glucose-Capillary: 103 mg/dL — ABNORMAL HIGH (ref 70–99)
Glucose-Capillary: 96 mg/dL (ref 70–99)
Glucose-Capillary: 97 mg/dL (ref 70–99)
Glucose-Capillary: 97 mg/dL (ref 70–99)
Glucose-Capillary: 98 mg/dL (ref 70–99)

## 2011-05-27 LAB — POCT I-STAT 3, ART BLOOD GAS (G3+)
Acid-base deficit: 1 mmol/L (ref 0.0–2.0)
Acid-base deficit: 2 mmol/L (ref 0.0–2.0)
Acid-base deficit: 2 mmol/L (ref 0.0–2.0)
Bicarbonate: 23.6 mEq/L (ref 20.0–24.0)
Bicarbonate: 23.5 mEq/L (ref 20.0–24.0)
Bicarbonate: 23.8 mEq/L (ref 20.0–24.0)
O2 Saturation: 93 %
O2 Saturation: 95 %
O2 Saturation: 95 %
Patient temperature: 34.5
Patient temperature: 37
Patient temperature: 37.6
TCO2: 25 mmol/L (ref 0–100)
TCO2: 25 mmol/L (ref 0–100)
TCO2: 25 mmol/L (ref 0–100)
pCO2 arterial: 33.8 mmHg — ABNORMAL LOW (ref 35.0–45.0)
pCO2 arterial: 41.8 mmHg (ref 35.0–45.0)
pCO2 arterial: 42.4 mmHg (ref 35.0–45.0)
pH, Arterial: 7.441 (ref 7.350–7.450)
pH, Arterial: 7.357 (ref 7.350–7.450)
pH, Arterial: 7.359 (ref 7.350–7.450)
pO2, Arterial: 58 mmHg — ABNORMAL LOW (ref 80.0–100.0)
pO2, Arterial: 80 mmHg (ref 80.0–100.0)
pO2, Arterial: 82 mmHg (ref 80.0–100.0)

## 2011-05-27 LAB — APTT: aPTT: 35 seconds (ref 24–37)

## 2011-05-27 LAB — MAGNESIUM: Magnesium: 2.4 mg/dL (ref 1.5–2.5)

## 2011-05-27 LAB — CREATININE, SERUM
Creatinine, Ser: 0.67 mg/dL (ref 0.50–1.35)
GFR calc Af Amer: >90 mL/min (ref >90)
GFR calc non Af Amer: >90 mL/min (ref >90)

## 2011-05-27 LAB — HEMOGLOBIN A1C
Hgb A1c MFr Bld: 6 % — ABNORMAL HIGH (ref <5.7)
Mean Plasma Glucose: 126 mg/dL — ABNORMAL HIGH (ref <117)

## 2011-05-27 LAB — PROTIME-INR
INR: 1.3 (ref 0.00–1.49)
Prothrombin Time: 16.4 seconds — ABNORMAL HIGH (ref 11.6–15.2)

## 2011-05-27 SURGERY — OFF PUMP CORONARY ARTERY BYPASS GRAFTING (CABG)
Anesthesia: General | Site: Chest | Wound class: Clean

## 2011-05-27 MED ORDER — ASPIRIN EC 325 MG PO TBEC
325.0000 mg | DELAYED_RELEASE_TABLET | Freq: Every day | ORAL | Status: DC
  Administered 2011-05-28 – 2011-05-30 (×3): 325 mg via ORAL
  Filled 2011-05-27 (×3): qty 1

## 2011-05-27 MED ORDER — ROCURONIUM BROMIDE 100 MG/10ML IV SOLN
INTRAVENOUS | Status: DC | PRN
  Administered 2011-05-27: 50 mg via INTRAVENOUS

## 2011-05-27 MED ORDER — BISACODYL 10 MG RE SUPP: 10.0000 mg | Freq: Every day | RECTAL | Status: DC

## 2011-05-27 MED ORDER — DEXTROSE 5 % IV SOLN: 1.5000 g | Freq: Two times a day (BID) | INTRAVENOUS | Status: DC

## 2011-05-27 MED ORDER — KETOROLAC TROMETHAMINE 30 MG/ML IJ SOLN
30.0000 mg | Freq: Four times a day (QID) | INTRAMUSCULAR | Status: AC
  Administered 2011-05-27 – 2011-05-29 (×7): 30 mg via INTRAVENOUS
  Filled 2011-05-27 (×7): qty 1

## 2011-05-27 MED ORDER — 0.9 % SODIUM CHLORIDE (POUR BTL) OPTIME
TOPICAL | Status: DC | PRN
  Administered 2011-05-27: 5000 mL

## 2011-05-27 MED ORDER — MOXIFLOXACIN HCL IN NACL 400 MG/250ML IV SOLN
400.0000 mg | INTRAVENOUS | Status: AC
  Administered 2011-05-28: 400 mg via INTRAVENOUS
  Filled 2011-05-27: qty 250

## 2011-05-27 MED ORDER — LIDOCAINE HCL (CARDIAC) 20 MG/ML IV SOLN
INTRAVENOUS | Status: DC | PRN
  Administered 2011-05-27: 80 mg via INTRAVENOUS

## 2011-05-27 MED ORDER — FAMOTIDINE IN NACL 20-0.9 MG/50ML-% IV SOLN
20.0000 mg | Freq: Two times a day (BID) | INTRAVENOUS | Status: DC
  Administered 2011-05-27: 20 mg via INTRAVENOUS

## 2011-05-27 MED ORDER — SODIUM CHLORIDE 0.9 % IJ SOLN
3.0000 mL | Freq: Two times a day (BID) | INTRAMUSCULAR | Status: DC
  Administered 2011-05-28: 3 mL via INTRAVENOUS

## 2011-05-27 MED ORDER — MIDAZOLAM HCL 2 MG/2ML IJ SOLN: 2.0000 mg | INTRAMUSCULAR | Status: DC | PRN

## 2011-05-27 MED ORDER — SODIUM CHLORIDE 0.45 % IV SOLN
INTRAVENOUS | Status: DC
  Administered 2011-05-27: 20 mL/h via INTRAVENOUS

## 2011-05-27 MED ORDER — LACTATED RINGERS IV SOLN: 500.0000 mL | Freq: Once | INTRAVENOUS | Status: AC | PRN

## 2011-05-27 MED ORDER — SODIUM CHLORIDE 0.9 % IJ SOLN: 3.0000 mL | INTRAMUSCULAR | Status: DC | PRN

## 2011-05-27 MED ORDER — INSULIN ASPART 100 UNIT/ML ~~LOC~~ SOLN: 0.0000 [IU] | SUBCUTANEOUS | Status: DC

## 2011-05-27 MED ORDER — MIDAZOLAM HCL 5 MG/5ML IJ SOLN
INTRAMUSCULAR | Status: DC | PRN
  Administered 2011-05-27: 4 mg via INTRAVENOUS
  Administered 2011-05-27: 1 mg via INTRAVENOUS
  Administered 2011-05-27: 3 mg via INTRAVENOUS

## 2011-05-27 MED ORDER — VECURONIUM BROMIDE 10 MG IV SOLR
INTRAVENOUS | Status: DC | PRN
  Administered 2011-05-27: 10 mg via INTRAVENOUS

## 2011-05-27 MED ORDER — INSULIN ASPART 100 UNIT/ML ~~LOC~~ SOLN: 0.0000 [IU] | SUBCUTANEOUS | Status: AC

## 2011-05-27 MED ORDER — LACTATED RINGERS IV SOLN
INTRAVENOUS | Status: DC | PRN
  Administered 2011-05-27 (×2): via INTRAVENOUS

## 2011-05-27 MED ORDER — ACETAMINOPHEN 160 MG/5ML PO SOLN: 975.0000 mg | Freq: Four times a day (QID) | ORAL | Status: DC

## 2011-05-27 MED ORDER — MAGNESIUM SULFATE 40 MG/ML IJ SOLN
4.0000 g | Freq: Once | INTRAMUSCULAR | Status: AC
  Administered 2011-05-27: 4 g via INTRAVENOUS
  Filled 2011-05-27: qty 100

## 2011-05-27 MED ORDER — KETOROLAC TROMETHAMINE 30 MG/ML IJ SOLN
INTRAMUSCULAR | Status: AC
  Administered 2011-05-27: 30 mg via INTRAVENOUS
  Filled 2011-05-27: qty 1

## 2011-05-27 MED ORDER — GLYCOPYRROLATE 0.2 MG/ML IJ SOLN
INTRAMUSCULAR | Status: DC | PRN
  Administered 2011-05-27: 0.2 mg via INTRAVENOUS

## 2011-05-27 MED ORDER — LACTATED RINGERS IV SOLN: INTRAVENOUS | Status: DC

## 2011-05-27 MED ORDER — PROPOFOL 10 MG/ML IV BOLUS
INTRAVENOUS | Status: DC | PRN
  Administered 2011-05-27: 60 mg via INTRAVENOUS

## 2011-05-27 MED ORDER — NITROGLYCERIN IN D5W 200-5 MCG/ML-% IV SOLN: 0.0000 ug/min | INTRAVENOUS | Status: DC

## 2011-05-27 MED ORDER — SIMVASTATIN 40 MG PO TABS
40.0000 mg | ORAL_TABLET | Freq: Every evening | ORAL | Status: DC
  Administered 2011-05-28 – 2011-05-30 (×3): 40 mg via ORAL
  Filled 2011-05-27 (×3): qty 1

## 2011-05-27 MED ORDER — ACETAMINOPHEN 160 MG/5ML PO SOLN: 650.0000 mg | ORAL | Status: AC

## 2011-05-27 MED ORDER — POTASSIUM CHLORIDE 10 MEQ/50ML IV SOLN
10.0000 meq | INTRAVENOUS | Status: AC
  Administered 2011-05-27 (×3): 10 meq via INTRAVENOUS

## 2011-05-27 MED ORDER — SODIUM CHLORIDE 0.9 % IV SOLN
INTRAVENOUS | Status: DC
  Administered 2011-05-28: 10 mL/h via INTRAVENOUS

## 2011-05-27 MED ORDER — ACETAMINOPHEN 160 MG/5ML PO SOLN
975.0000 mg | Freq: Four times a day (QID) | ORAL | Status: DC
  Filled 2011-05-27: qty 40.6

## 2011-05-27 MED ORDER — SODIUM CHLORIDE 0.9 % IR SOLN: Status: DC | PRN

## 2011-05-27 MED ORDER — MORPHINE SULFATE 4 MG/ML IJ SOLN
2.0000 mg | INTRAMUSCULAR | Status: DC | PRN
  Administered 2011-05-27: 4 mg via INTRAVENOUS
  Administered 2011-05-27 (×2): 2 mg via INTRAVENOUS
  Administered 2011-05-28: 4 mg via INTRAVENOUS
  Administered 2011-05-28: 2 mg via INTRAVENOUS
  Filled 2011-05-27 (×4): qty 1

## 2011-05-27 MED ORDER — METOPROLOL TARTRATE 25 MG/10 ML ORAL SUSPENSION
12.5000 mg | Freq: Two times a day (BID) | ORAL | Status: DC
  Filled 2011-05-27: qty 5

## 2011-05-27 MED ORDER — SODIUM CHLORIDE 0.9 % IV SOLN
INTRAVENOUS | Status: DC
  Filled 2011-05-27: qty 1

## 2011-05-27 MED ORDER — ALBUMIN HUMAN 5 % IV SOLN
250.0000 mL | INTRAVENOUS | Status: DC | PRN
  Administered 2011-05-27: 250 mL via INTRAVENOUS

## 2011-05-27 MED ORDER — FENTANYL CITRATE 0.05 MG/ML IJ SOLN
INTRAMUSCULAR | Status: DC | PRN
  Administered 2011-05-27 (×2): 100 ug via INTRAVENOUS
  Administered 2011-05-27: 50 ug via INTRAVENOUS
  Administered 2011-05-27: 500 ug via INTRAVENOUS
  Administered 2011-05-27: 250 ug via INTRAVENOUS

## 2011-05-27 MED ORDER — ACETAMINOPHEN 500 MG PO TABS: 1000.0000 mg | ORAL_TABLET | Freq: Four times a day (QID) | ORAL | Status: DC

## 2011-05-27 MED ORDER — HEPARIN SODIUM (PORCINE) 1000 UNIT/ML IJ SOLN
INTRAMUSCULAR | Status: DC | PRN
  Administered 2011-05-27: 12000 [IU] via INTRAVENOUS

## 2011-05-27 MED ORDER — METOPROLOL TARTRATE 12.5 MG HALF TABLET
12.5000 mg | ORAL_TABLET | Freq: Two times a day (BID) | ORAL | Status: DC
  Administered 2011-05-28 (×2): 12.5 mg via ORAL
  Filled 2011-05-27 (×5): qty 1

## 2011-05-27 MED ORDER — PHENYLEPHRINE HCL 10 MG/ML IJ SOLN
0.0000 ug/min | INTRAVENOUS | Status: DC
  Filled 2011-05-27: qty 2

## 2011-05-27 MED ORDER — HEMOSTATIC AGENTS (NO CHARGE) OPTIME
TOPICAL | Status: DC | PRN
  Administered 2011-05-27: 4 via TOPICAL

## 2011-05-27 MED ORDER — SODIUM CHLORIDE 0.9 % IV SOLN: 250.0000 mL | INTRAVENOUS | Status: DC

## 2011-05-27 MED ORDER — METOPROLOL TARTRATE 1 MG/ML IV SOLN: 2.5000 mg | INTRAVENOUS | Status: DC | PRN

## 2011-05-27 MED ORDER — ONDANSETRON HCL 4 MG/2ML IJ SOLN: 4.0000 mg | Freq: Four times a day (QID) | INTRAMUSCULAR | Status: DC | PRN

## 2011-05-27 MED ORDER — SODIUM CHLORIDE 0.9 % IV SOLN
1000.0000 mg | Freq: Once | INTRAVENOUS | Status: AC
  Administered 2011-05-27: 1000 mg via INTRAVENOUS
  Filled 2011-05-27: qty 1000

## 2011-05-27 MED ORDER — LACTATED RINGERS IV SOLN
INTRAVENOUS | Status: DC | PRN
  Administered 2011-05-27 (×2): via INTRAVENOUS

## 2011-05-27 MED ORDER — MORPHINE SULFATE 2 MG/ML IJ SOLN
1.0000 mg | INTRAMUSCULAR | Status: DC | PRN
  Administered 2011-05-27: 2 mg via INTRAVENOUS
  Filled 2011-05-27: qty 1

## 2011-05-27 MED ORDER — ACETAMINOPHEN 500 MG PO TABS
1000.0000 mg | ORAL_TABLET | Freq: Four times a day (QID) | ORAL | Status: DC
  Administered 2011-05-27 – 2011-05-30 (×10): 1000 mg via ORAL
  Filled 2011-05-27 (×15): qty 2

## 2011-05-27 MED ORDER — SODIUM CHLORIDE 0.9 % IV SOLN
0.1000 ug/kg/h | INTRAVENOUS | Status: DC
  Filled 2011-05-27: qty 2

## 2011-05-27 MED ORDER — INSULIN REGULAR BOLUS VIA INFUSION
0.0000 [IU] | Freq: Three times a day (TID) | INTRAVENOUS | Status: DC
  Filled 2011-05-27: qty 10

## 2011-05-27 MED ORDER — ASPIRIN 81 MG PO CHEW: 324.0000 mg | CHEWABLE_TABLET | Freq: Every day | ORAL | Status: DC

## 2011-05-27 MED ORDER — BISACODYL 5 MG PO TBEC
10.0000 mg | DELAYED_RELEASE_TABLET | Freq: Every day | ORAL | Status: DC
  Administered 2011-05-28 – 2011-05-29 (×2): 10 mg via ORAL
  Filled 2011-05-27 (×2): qty 2

## 2011-05-27 MED ORDER — PANTOPRAZOLE SODIUM 40 MG PO TBEC
40.0000 mg | DELAYED_RELEASE_TABLET | Freq: Every day | ORAL | Status: DC
  Administered 2011-05-29 – 2011-05-30 (×2): 40 mg via ORAL
  Filled 2011-05-27 (×2): qty 1

## 2011-05-27 MED ORDER — OXYCODONE HCL 5 MG PO TABS
5.0000 mg | ORAL_TABLET | ORAL | Status: DC | PRN
  Administered 2011-05-28: 5 mg via ORAL
  Administered 2011-05-29 – 2011-05-30 (×3): 10 mg via ORAL
  Filled 2011-05-27 (×2): qty 1
  Filled 2011-05-27 (×2): qty 2
  Filled 2011-05-27: qty 1
  Filled 2011-05-27: qty 2

## 2011-05-27 MED ORDER — METOPROLOL TARTRATE 12.5 MG HALF TABLET
12.5000 mg | ORAL_TABLET | Freq: Two times a day (BID) | ORAL | Status: DC
  Filled 2011-05-27: qty 1

## 2011-05-27 MED ORDER — ACETAMINOPHEN 650 MG RE SUPP
650.0000 mg | RECTAL | Status: AC
  Administered 2011-05-27: 650 mg via RECTAL

## 2011-05-27 MED ORDER — METOPROLOL TARTRATE 25 MG/10 ML ORAL SUSPENSION
12.5000 mg | Freq: Two times a day (BID) | ORAL | Status: DC
  Filled 2011-05-27 (×5): qty 5

## 2011-05-27 MED ORDER — PHENYLEPHRINE HCL 10 MG/ML IJ SOLN
10.0000 mg | INTRAVENOUS | Status: DC | PRN
  Administered 2011-05-27: 1 ug/min via INTRAVENOUS

## 2011-05-27 MED ORDER — PROTAMINE SULFATE 10 MG/ML IV SOLN
INTRAVENOUS | Status: DC | PRN
  Administered 2011-05-27: 150 mg via INTRAVENOUS

## 2011-05-27 MED ORDER — DOCUSATE SODIUM 100 MG PO CAPS
200.0000 mg | ORAL_CAPSULE | Freq: Every day | ORAL | Status: DC
  Administered 2011-05-28 – 2011-05-30 (×3): 200 mg via ORAL
  Filled 2011-05-27 (×3): qty 2

## 2011-05-27 SURGICAL SUPPLY — 109 items
ADAPTER CARDIO PERF ANTE/RETRO (ADAPTER) IMPLANT
ADPR PRFSN 84XANTGRD RTRGD (ADAPTER)
APPLIER CLIP 9.375 MED OPEN (MISCELLANEOUS)
APPLIER CLIP 9.375 SM OPEN (CLIP)
APR CLP MED 9.3 20 MLT OPN (MISCELLANEOUS)
APR CLP SM 9.3 20 MLT OPN (CLIP)
ATTRACTOMAT 16X20 MAGNETIC DRP (DRAPES) ×2 IMPLANT
BAG DECANTER FOR FLEXI CONT (MISCELLANEOUS) ×2 IMPLANT
BANDAGE ELASTIC 4 VELCRO ST LF (GAUZE/BANDAGES/DRESSINGS) ×2 IMPLANT
BANDAGE ELASTIC 6 VELCRO ST LF (GAUZE/BANDAGES/DRESSINGS) ×2 IMPLANT
BANDAGE GAUZE ELAST BULKY 4 IN (GAUZE/BANDAGES/DRESSINGS) ×2 IMPLANT
BASKET HEART (ORDER IN 25'S) (MISCELLANEOUS)
BASKET HEART (ORDER IN 25S) (MISCELLANEOUS) IMPLANT
BLADE SAW STERNAL (BLADE) ×2 IMPLANT
BLADE SURG 11 STRL SS (BLADE) IMPLANT
BLADE SURG ROTATE 9660 (MISCELLANEOUS) IMPLANT
BLOWER MISTER ×1 IMPLANT
CANISTER SUCTION 2500CC (MISCELLANEOUS) ×2 IMPLANT
CANNULA GUNDRY RCSP 15FR (MISCELLANEOUS) IMPLANT
CARDIAC SUCTION (MISCELLANEOUS) ×1 IMPLANT
CATH ROBINSON RED A/P 18FR (CATHETERS) ×1 IMPLANT
CATH THORACIC 36FR (CATHETERS) ×1 IMPLANT
CATH THORACIC 36FR RT ANG (CATHETERS) ×1 IMPLANT
CLIP APPLIE 9.375 MED OPEN (MISCELLANEOUS) IMPLANT
CLIP APPLIE 9.375 SM OPEN (CLIP) IMPLANT
CLIP FOGARTY SPRING 6M (CLIP) IMPLANT
CLIP RETRACTION 3.0MM CORONARY (MISCELLANEOUS) ×1 IMPLANT
CLIP TI MEDIUM 24 (CLIP) IMPLANT
CLIP TI WIDE RED SMALL 24 (CLIP) ×2 IMPLANT
CLOTH BEACON ORANGE TIMEOUT ST (SAFETY) ×2 IMPLANT
CONN Y 3/8X3/8X3/8  BEN (MISCELLANEOUS)
CONN Y 3/8X3/8X3/8 BEN (MISCELLANEOUS) IMPLANT
CORONARY SUCKER SOFT TIP 10052 (MISCELLANEOUS) ×2 IMPLANT
COVER SURGICAL LIGHT HANDLE (MISCELLANEOUS) ×4 IMPLANT
CRADLE DONUT ADULT HEAD (MISCELLANEOUS) ×2 IMPLANT
DRAPE CARDIOVASCULAR INCISE (DRAPES) ×2
DRAPE SLUSH/WARMER DISC (DRAPES) IMPLANT
DRAPE SRG 135X102X78XABS (DRAPES) ×1 IMPLANT
DRSG COVADERM 4X14 (GAUZE/BANDAGES/DRESSINGS) ×2 IMPLANT
ELECT CAUTERY BLADE 6.4 (BLADE) IMPLANT
ELECT REM PT RETURN 9FT ADLT (ELECTROSURGICAL) ×4
ELECTRODE REM PT RTRN 9FT ADLT (ELECTROSURGICAL) ×2 IMPLANT
FILTER STRAW FLUID ASPIR (MISCELLANEOUS) ×1 IMPLANT
GLOVE EUDERMIC 7 POWDERFREE (GLOVE) ×3 IMPLANT
GOWN STRL NON-REIN LRG LVL3 (GOWN DISPOSABLE) ×8 IMPLANT
HEMOSTAT POWDER SURGIFOAM 1G (HEMOSTASIS) ×4 IMPLANT
HEMOSTAT SURGICEL 2X14 (HEMOSTASIS) IMPLANT
INSERT FOGARTY XLG (MISCELLANEOUS) IMPLANT
KIT BASIN OR (CUSTOM PROCEDURE TRAY) ×2 IMPLANT
KIT PAIN CUSTOM (MISCELLANEOUS) IMPLANT
KIT ROOM TURNOVER OR (KITS) ×2 IMPLANT
KIT SUCTION CATH 14FR (SUCTIONS) IMPLANT
KIT VASOVIEW W/TROCAR VH 2000 (KITS) ×1 IMPLANT
LINE VENT (MISCELLANEOUS) IMPLANT
MARKER GRAFT CORONARY BYPASS (MISCELLANEOUS) IMPLANT
MARKER SKIN DUAL TIP RULER LAB (MISCELLANEOUS) ×2 IMPLANT
NS IRRIG 1000ML POUR BTL (IV SOLUTION) ×10 IMPLANT
PACK OPEN HEART (CUSTOM PROCEDURE TRAY) ×2 IMPLANT
PAD ARMBOARD 7.5X6 YLW CONV (MISCELLANEOUS) ×4 IMPLANT
PAD CARDIAC INSULATION (MISCELLANEOUS) ×1 IMPLANT
PEDIATRIC SUCKERS (MISCELLANEOUS) IMPLANT
PENCIL BUTTON HOLSTER BLD 10FT (ELECTRODE) IMPLANT
PUNCH AORTIC ROTATE 4.0MM (MISCELLANEOUS) IMPLANT
PUNCH AORTIC ROTATE 4.5MM 8IN (MISCELLANEOUS) IMPLANT
PUNCH AORTIC ROTATE 5MM 8IN (MISCELLANEOUS) IMPLANT
SET CARDIOPLEGIA MPS 5001102 (MISCELLANEOUS) IMPLANT
SOLUTION ANTI FOG 6CC (MISCELLANEOUS) IMPLANT
SPONGE GAUZE 4X4 12PLY (GAUZE/BANDAGES/DRESSINGS) ×2 IMPLANT
STABILIZER COHN CARD 89 9340 (MISCELLANEOUS) IMPLANT
STOPCOCK 4 WAY LG BORE MALE ST (IV SETS) IMPLANT
SUT BONE WAX W31G (SUTURE) IMPLANT
SUT MNCRL AB 4-0 PS2 18 (SUTURE) IMPLANT
SUT PROLENE 3 0 SH DA (SUTURE) IMPLANT
SUT PROLENE 4 0 RB 1 (SUTURE)
SUT PROLENE 4 0 SH DA (SUTURE) IMPLANT
SUT PROLENE 4-0 RB1 .5 CRCL 36 (SUTURE) IMPLANT
SUT PROLENE 6 0 C 1 30 (SUTURE) IMPLANT
SUT PROLENE 7 0 BV 1 (SUTURE) ×1 IMPLANT
SUT PROLENE 8 0 BV175 6 (SUTURE) ×2 IMPLANT
SUT SILK  1 MH (SUTURE) ×2
SUT SILK 1 MH (SUTURE) IMPLANT
SUT SILK 1 SH (SUTURE) IMPLANT
SUT SILK 1 TIES 10X30 (SUTURE) ×2 IMPLANT
SUT STEEL 6MS V (SUTURE) ×1 IMPLANT
SUT STEEL STERNAL CCS#1 18IN (SUTURE) IMPLANT
SUT STEEL SZ 6 DBL 3X14 BALL (SUTURE) ×1 IMPLANT
SUT VIC AB 1 CTX 27 (SUTURE) ×2 IMPLANT
SUT VIC AB 1 CTX 36 (SUTURE)
SUT VIC AB 1 CTX36XBRD ANBCTR (SUTURE) IMPLANT
SUT VIC AB 2-0 CT1 27 (SUTURE)
SUT VIC AB 2-0 CT1 TAPERPNT 27 (SUTURE) IMPLANT
SUT VIC AB 2-0 CTX 27 (SUTURE) IMPLANT
SUT VIC AB 3-0 SH 27 (SUTURE)
SUT VIC AB 3-0 SH 27X BRD (SUTURE) IMPLANT
SUT VIC AB 3-0 X1 27 (SUTURE) IMPLANT
SUT VICRYL 4-0 PS2 18IN ABS (SUTURE) IMPLANT
SUTURE E-PAK OPEN HEART (SUTURE) ×2 IMPLANT
SYS GUIDANT ACHIEVE OFF PUMP (MISCELLANEOUS) ×2 IMPLANT
SYSTEM SAHARA CHEST DRAIN ATS (WOUND CARE) ×2 IMPLANT
TAPE CLOTH SURG 4X10 WHT LF (GAUZE/BANDAGES/DRESSINGS) ×1 IMPLANT
TAPES RETRACTO (MISCELLANEOUS) IMPLANT
TOWEL OR 17X24 6PK STRL BLUE (TOWEL DISPOSABLE) ×2 IMPLANT
TOWEL OR 17X26 10 PK STRL BLUE (TOWEL DISPOSABLE) ×2 IMPLANT
TRAY FOLEY IC TEMP SENS 16FR (CATHETERS) ×2 IMPLANT
TUBING INSUFFLATION 10FT LAP (TUBING) ×2 IMPLANT
TUNNELER SHEATH ON-Q 11GX8 (MISCELLANEOUS) ×2 IMPLANT
UNDERPAD 30X30 INCONTINENT (UNDERPADS AND DIAPERS) ×2 IMPLANT
WAND VISUFLOW SURG W/TUBING (MISCELLANEOUS) IMPLANT
WATER STERILE IRR 1000ML POUR (IV SOLUTION) ×4 IMPLANT

## 2011-05-27 NOTE — Transfer of Care (Signed)
Immediate Anesthesia Transfer of Care Note  Patient: [name]  Procedure(s) Performed: Procedure(s) (LRB): OFF PUMP CORONARY ARTERY BYPASS GRAFTING (CABG) (N/A)  Patient Location: PACU  Anesthesia Type: General  Level of Consciousness: sedated  Airway & Oxygen Therapy: Patient remains intubated per anesthesia plan and Patient placed on Ventilator (see vital sign flow sheet for setting)  Post-op Assessment: Report given to PACU RN and Post -op Vital signs reviewed and stable  Post vital signs: Reviewed and stable  Complications: No apparent anesthesia complications

## 2011-05-27 NOTE — H&P (Signed)
Reason for Consult:Severe ostial LAD disease  Referring Physician: Dr. Hinda Lenis Logsdon is an 61 y.o. male.  HPI:61 yo WM with no prior history of CAD. First noted chest discomfort near the end of last summer after mowing his yard. Lasted minutes and resolved with rest. Did well until a couple of weeks ago when he again noted exertional chest discomfort. He describes this as an aching/ pressure type sensation. He had a stress test which showed a large area of the anteroapical wall and septum at risk. Cardiac cath today revealed critical stenosis at the ostium of the LAD. He did not have any other hemodynamically significant disease.  Past Medical History   Diagnosis  Date   .  Chest pain    .  Coronary artery disease    .  Hypertension    .  Arthritis     Past Surgical History   Procedure  Date   .  Knee arthroscopy w/ debridement    .  Cervical disc surgery     History reviewed. No pertinent family history.  Social History: reports that he has been smoking Cigarettes. He has a 35 pack-year smoking history. He has never used smokeless tobacco. He reports that he does not drink alcohol or use illicit drugs.  Allergies:  Allergies   Allergen  Reactions   .  Penicillins  Other (See Comments)     unknown    Medications:  I have reviewed the patient's current medications.  Prior to Admission:  Prescriptions prior to admission   Medication  Sig  Dispense  Refill   .  aspirin EC 81 MG tablet  Take 162 mg by mouth daily.     .  metoprolol succinate (TOPROL-XL) 50 MG 24 hr tablet  Take 50 mg by mouth daily. Take with or immediately following a meal.     .  Omega-3 Fatty Acids (FISH OIL PO)  Take 1 capsule by mouth 3 (three) times daily.     Marland Kitchen  oxyCODONE-acetaminophen (PERCOCET) 10-325 MG per tablet  Take 1 tablet by mouth daily.     .  pantoprazole (PROTONIX) 40 MG tablet  Take 40 mg by mouth daily.     .  ramipril (ALTACE) 5 MG capsule  Take 5 mg by mouth daily.     .   simvastatin (ZOCOR) 40 MG tablet  Take 40 mg by mouth every evening.     .  nitroGLYCERIN (NITROSTAT) 0.4 MG SL tablet  Place 0.4 mg under the tongue every 5 (five) minutes as needed. For chest pain      Results for orders placed during the hospital encounter of 05/26/11 (from the past 48 hour(s))   PROTIME-INR Status: Normal    Collection Time    05/26/11 7:12 AM   Component  Value  Range  Comment    Prothrombin Time  13.6  11.6 - 15.2 (seconds)     INR  1.02  0.00 - 1.49     No results found.  Review of Systems  Constitutional: Negative for fever, chills, weight loss and malaise/fatigue.  Respiratory: Negative for shortness of breath and wheezing.  Cardiovascular: Positive for chest pain. Negative for claudication, leg swelling and PND.  Gastrointestinal: Negative.  Genitourinary: Negative.  Neurological: Positive for sensory change, focal weakness (right leg numbness and weakness, walks with cane) and weakness. Negative for speech change.  Endo/Heme/Allergies: Does not bruise/bleed easily.  All other systems reviewed and are negative.   Blood pressure 162/84,  pulse 56, temperature 97.6 F (36.4 C), temperature source Oral, resp. rate 18, height 5\' 4"  (1.626 m), weight 216 lb (97.977 kg), SpO2 97.00%.  Physical Exam  Vitals reviewed.  Constitutional: He is oriented to person, place, and time. He appears well-developed and well-nourished.  HENT:  Head: Normocephalic and atraumatic.  Eyes: EOM are normal. Pupils are equal, round, and reactive to light.  Neck: Neck supple. No JVD present. No thyromegaly present.  Cardiovascular: Normal rate, regular rhythm, normal heart sounds and intact distal pulses. Exam reveals no gallop and no friction rub.  No murmur heard.  Respiratory: Effort normal and breath sounds normal. He has no wheezes. He has no rales.  GI: Soft. There is no tenderness.  Musculoskeletal: He exhibits no edema.  Lymphadenopathy:  He has no cervical adenopathy.    Neurological: He is alert and oriented to person, place, and time. No cranial nerve deficit.  Skin: Skin is warm and dry.   Assessment/Plan:  61 yo WM with exertional again and ostial LAD disease not amenable to percutaneous intervention. CABG x 1 with LIMA to LAD indicated for relief of symptoms and myocardial preservation. He is a good candidate for off pump grafting.  I have discussed with the patient the general nature of the procedure, need for general anesthesia,and incisions to be used. I have discussed the expected hospital stay, overall recovery and short and long term outcomes. He understands the risks include but are not limited to death, stroke, MI, DVT/PE, bleeding, possible need for transfusion, infections, other organ system dysfunction including respiratory, renal, or GI complications. He understands that CABG is palliative and not a guarantee against possible future cardiac events. He understands and accepts the risks of the procedure and agrees to proceed.  For OPCAB x 1 in AM 3/27  Marvia Troost C  05/26/2011, 6:07 PM    No change in last 14 hours

## 2011-05-27 NOTE — Interval H&P Note (Signed)
History and Physical Interval Note:  05/27/2011 8:02 AM  [name]  has presented today for surgery, with the diagnosis of CAD  The various methods of treatment have been discussed with the patient and family. After consideration of risks, benefits and other options for treatment, the patient has consented to  Procedure(s) (LRB): OFF PUMP CORONARY ARTERY BYPASS GRAFTING (CABG) (N/A) as a surgical intervention .  The patients' history has been reviewed, patient examined, no change in status, stable for surgery.  I have reviewed the patients' chart and labs.  Questions were answered to the patient's satisfaction.     HENDRICKSON,STEVEN C

## 2011-05-27 NOTE — OR Nursing (Signed)
Made first call to 2300 SICU at 1134. Made call to volunteer desk to inform family of patient status in surgery at 1134. Made second call to 2300 SICU at 1150.

## 2011-05-27 NOTE — Brief Op Note (Addendum)
                   [address]            [place] [address]          [phone]    05/26/2011 - 05/27/2011  11:17 AM  PATIENT:  Oswaldo Done Reino  61 y.o. male  PRE-OPERATIVE DIAGNOSIS:  CORONARY ARTERY DISEASE  POST-OPERATIVE DIAGNOSIS:  CORONARY ARTERY DISEASE  PROCEDURE:  Procedure(s): OFF PUMP CORONARY ARTERY BYPASS GRAFTING (CABG)x1 LIMA-LAD  SURGEON:  Surgeon(s): Loreli Slot, MD  PHYSICIAN ASSISTANT: WAYNE GOLD PA-C  ANESTHESIA:   general  PATIENT CONDITION:  ICU - intubated and hemodynamically stable.  PRE-OPERATIVE WEIGHT: 98kg  COMPLICATIONS:  NO KNOWN  Good quality target and conduit

## 2011-05-27 NOTE — Progress Notes (Signed)
Patient ID: [name], male   DOB: Jul 04, 1950, 61 y.o.   MRN: [id number] C/o incisional pain  Hemodynamics stable  Minimal CT output  Will add Toradol for pain control

## 2011-05-27 NOTE — Anesthesia Preprocedure Evaluation (Signed)
Anesthesia Evaluation  Patient identified by MRN, date of birth, ID band Patient awake    Reviewed: Allergy & Precautions, H&P , NPO status , Patient's Chart, lab work & pertinent test results, reviewed documented beta blocker date and time   Airway Mallampati: III TM Distance: >3 FB Neck ROM: Full    Dental  (+) Teeth Intact, Poor Dentition and Dental Advisory Given   Pulmonary Current Smoker,          Cardiovascular hypertension, Pt. on medications and Pt. on home beta blockers + angina with exertion + CAD     Neuro/Psych    GI/Hepatic   Endo/Other    Renal/GU      Musculoskeletal   Abdominal   Peds  Hematology   Anesthesia Other Findings   Reproductive/Obstetrics                           Anesthesia Physical Anesthesia Plan  ASA: III  Anesthesia Plan: General   Post-op Pain Management:    Induction: Intravenous  Airway Management Planned: Oral ETT  Additional Equipment: Arterial line, PA Cath and CVP  Intra-op Plan:   Post-operative Plan: Post-operative intubation/ventilation  Informed Consent: I have reviewed the patients History and Physical, chart, labs and discussed the procedure including the risks, benefits and alternatives for the proposed anesthesia with the patient or authorized representative who has indicated his/her understanding and acceptance.   Dental advisory given  Plan Discussed with: CRNA, Anesthesiologist and Surgeon  Anesthesia Plan Comments:         Anesthesia Quick Evaluation

## 2011-05-27 NOTE — Plan of Care (Signed)
Problem: Phase II Progression Outcomes Goal: Vascular site scale level 0 - I Vascular Site Scale Level 0: No bruising/bleeding/hematoma Level I (Mild): Bruising/Ecchymosis, minimal bleeding/ooozing, palpable hematoma < 3 cm Level II (Moderate): Bleeding not affecting hemodynamic parameters, pseudoaneurysm, palpable hematoma > 3 cm  Outcome: Completed/Met Date Met:  05/27/11 Level 0

## 2011-05-27 NOTE — Preoperative (Signed)
Beta Blockers   Reason not to administer Beta Blockers:pt took BB 05-27-11 in am

## 2011-05-27 NOTE — Anesthesia Postprocedure Evaluation (Signed)
  Anesthesia Post-op Note  Patient: [name]  Procedure(s) Performed: Procedure(s) (LRB): OFF PUMP CORONARY ARTERY BYPASS GRAFTING (CABG) (N/A)  Patient Location: PACU  Anesthesia Type: General  Level of Consciousness: sedated and unresponsive  Airway and Oxygen Therapy: Patient remains intubated per anesthesia plan and Patient placed on Ventilator (see vital sign flow sheet for setting)  Post-op Pain: none  Post-op Assessment: Post-op Vital signs reviewed, Patient's Cardiovascular Status Stable, Respiratory Function Stable, Patent Airway, No signs of Nausea or vomiting and Pain level controlled  Post-op Vital Signs: stable  Complications: No apparent anesthesia complications

## 2011-05-27 NOTE — Procedures (Signed)
Extubation Procedure Note  Patient Details:   Name: [name] DOB: 08-09-1950 MRN: [id number]   Airway Documentation:  Airway 8 mm (Active)    Evaluation  O2 sats: stable throughout Complications: No apparent complications Patient did tolerate procedure well. Bilateral Breath Sounds: Clear;Diminished   Yes  Clearance Coots 05/27/2011, 4:10 PM Pt extubated and now wearing Lindy @ 4lpm. Sats 96-98%.

## 2011-05-28 ENCOUNTER — Encounter (HOSPITAL_COMMUNITY): Payer: Self-pay | Admitting: *Deleted

## 2011-05-28 ENCOUNTER — Inpatient Hospital Stay (HOSPITAL_COMMUNITY): Payer: Medicaid Other

## 2011-05-28 DIAGNOSIS — R9439 Abnormal result of other cardiovascular function study: Secondary | ICD-10-CM

## 2011-05-28 DIAGNOSIS — Z720 Tobacco use: Secondary | ICD-10-CM

## 2011-05-28 DIAGNOSIS — E785 Hyperlipidemia, unspecified: Secondary | ICD-10-CM | POA: Diagnosis present

## 2011-05-28 DIAGNOSIS — I2089 Other forms of angina pectoris: Secondary | ICD-10-CM

## 2011-05-28 DIAGNOSIS — I208 Other forms of angina pectoris: Secondary | ICD-10-CM

## 2011-05-28 DIAGNOSIS — I2510 Atherosclerotic heart disease of native coronary artery without angina pectoris: Secondary | ICD-10-CM | POA: Diagnosis not present

## 2011-05-28 HISTORY — DX: Other forms of angina pectoris: I20.8

## 2011-05-28 HISTORY — DX: Tobacco use: Z72.0

## 2011-05-28 HISTORY — DX: Abnormal result of other cardiovascular function study: R94.39

## 2011-05-28 HISTORY — DX: Other forms of angina pectoris: I20.89

## 2011-05-28 HISTORY — DX: Hyperlipidemia, unspecified: E78.5

## 2011-05-28 LAB — GLUCOSE, CAPILLARY
Glucose-Capillary: 103 mg/dL — ABNORMAL HIGH (ref 70–99)
Glucose-Capillary: 94 mg/dL (ref 70–99)
Glucose-Capillary: 105 mg/dL — ABNORMAL HIGH (ref 70–99)
Glucose-Capillary: 176 mg/dL — ABNORMAL HIGH (ref 70–99)

## 2011-05-28 LAB — CBC
HCT: 38.6 % — ABNORMAL LOW (ref 39.0–52.0)
Hemoglobin: 13.3 g/dL (ref 13.0–17.0)
MCH: 29.6 pg (ref 26.0–34.0)
MCHC: 34.5 g/dL (ref 30.0–36.0)
MCV: 85.8 fL (ref 78.0–100.0)
Platelets: 167 10*3/uL (ref 150–400)
RBC: 4.5 MIL/uL (ref 4.22–5.81)
RDW: 14 % (ref 11.5–15.5)
WBC: 12 10*3/uL — ABNORMAL HIGH (ref 4.0–10.5)

## 2011-05-28 LAB — BASIC METABOLIC PANEL
BUN: 13 mg/dL (ref 6–23)
CO2: 24 mEq/L (ref 19–32)
Calcium: 8.4 mg/dL (ref 8.4–10.5)
Chloride: 105 mEq/L (ref 96–112)
Creatinine, Ser: 0.72 mg/dL (ref 0.50–1.35)
GFR calc Af Amer: >90 mL/min (ref >90)
GFR calc non Af Amer: >90 mL/min (ref >90)
Glucose, Bld: 108 mg/dL — ABNORMAL HIGH (ref 70–99)
Potassium: 4.1 mEq/L (ref 3.5–5.1)
Sodium: 137 mEq/L (ref 135–145)

## 2011-05-28 LAB — MAGNESIUM: Magnesium: 2.2 mg/dL (ref 1.5–2.5)

## 2011-05-28 MED ORDER — SODIUM CHLORIDE 0.9 % IJ SOLN
3.0000 mL | Freq: Two times a day (BID) | INTRAMUSCULAR | Status: DC
  Administered 2011-05-29 – 2011-05-30 (×2): 3 mL via INTRAVENOUS

## 2011-05-28 MED ORDER — MAGNESIUM HYDROXIDE 400 MG/5ML PO SUSP: 30.0000 mL | Freq: Every day | ORAL | Status: DC | PRN

## 2011-05-28 MED ORDER — RAMIPRIL 5 MG PO CAPS
5.0000 mg | ORAL_CAPSULE | Freq: Every day | ORAL | Status: DC
  Administered 2011-05-29 – 2011-05-30 (×2): 5 mg via ORAL
  Filled 2011-05-28 (×2): qty 1

## 2011-05-28 MED ORDER — POTASSIUM CHLORIDE 10 MEQ/100ML IV SOLN
10.0000 meq | INTRAVENOUS | Status: DC
  Administered 2011-05-28: 10 meq via INTRAVENOUS
  Filled 2011-05-28: qty 300

## 2011-05-28 MED ORDER — SODIUM CHLORIDE 0.9 % IV SOLN: 250.0000 mL | INTRAVENOUS | Status: DC | PRN

## 2011-05-28 MED ORDER — POTASSIUM CHLORIDE CRYS ER 20 MEQ PO TBCR
20.0000 meq | EXTENDED_RELEASE_TABLET | Freq: Two times a day (BID) | ORAL | Status: DC
  Administered 2011-05-29 – 2011-05-30 (×3): 20 meq via ORAL
  Filled 2011-05-28 (×5): qty 1

## 2011-05-28 MED ORDER — FUROSEMIDE 10 MG/ML IJ SOLN
40.0000 mg | Freq: Once | INTRAMUSCULAR | Status: AC
  Administered 2011-05-28: 40 mg via INTRAVENOUS
  Filled 2011-05-28: qty 4

## 2011-05-28 MED ORDER — LORAZEPAM 0.5 MG PO TABS: 0.5000 mg | ORAL_TABLET | Freq: Four times a day (QID) | ORAL | Status: DC | PRN

## 2011-05-28 MED ORDER — ENOXAPARIN SODIUM 40 MG/0.4ML ~~LOC~~ SOLN
40.0000 mg | Freq: Every day | SUBCUTANEOUS | Status: DC
  Administered 2011-05-28 – 2011-05-29 (×2): 40 mg via SUBCUTANEOUS
  Filled 2011-05-28 (×3): qty 0.4

## 2011-05-28 MED ORDER — MOVING RIGHT ALONG BOOK
Freq: Once | Status: AC
  Administered 2011-05-28: 17:00:00
  Filled 2011-05-28: qty 1

## 2011-05-28 MED ORDER — ZOLPIDEM TARTRATE 5 MG PO TABS: 5.0000 mg | ORAL_TABLET | Freq: Every evening | ORAL | Status: DC | PRN

## 2011-05-28 MED ORDER — POTASSIUM CHLORIDE CRYS ER 20 MEQ PO TBCR
EXTENDED_RELEASE_TABLET | ORAL | Status: AC
  Filled 2011-05-28: qty 1

## 2011-05-28 MED ORDER — FUROSEMIDE 40 MG PO TABS
40.0000 mg | ORAL_TABLET | Freq: Every day | ORAL | Status: AC
  Administered 2011-05-29 – 2011-05-30 (×2): 40 mg via ORAL
  Filled 2011-05-28 (×3): qty 1

## 2011-05-28 MED ORDER — GUAIFENESIN-DM 100-10 MG/5ML PO SYRP: 15.0000 mL | ORAL_SOLUTION | ORAL | Status: DC | PRN

## 2011-05-28 MED ORDER — OMEGA-3-ACID ETHYL ESTERS 1 G PO CAPS
1.0000 g | ORAL_CAPSULE | Freq: Two times a day (BID) | ORAL | Status: DC
  Administered 2011-05-28 – 2011-05-30 (×5): 1 g via ORAL
  Filled 2011-05-28 (×6): qty 1

## 2011-05-28 MED ORDER — SODIUM CHLORIDE 0.9 % IJ SOLN
3.0000 mL | INTRAMUSCULAR | Status: DC | PRN
  Administered 2011-05-28 (×2): 3 mL via INTRAVENOUS

## 2011-05-28 MED ORDER — POTASSIUM CHLORIDE 10 MEQ/50ML IV SOLN: 10.0000 meq | INTRAVENOUS | Status: DC

## 2011-05-28 MED ORDER — ALUM & MAG HYDROXIDE-SIMETH 200-200-20 MG/5ML PO SUSP: 15.0000 mL | ORAL | Status: DC | PRN

## 2011-05-28 MED ORDER — POTASSIUM CHLORIDE CRYS ER 20 MEQ PO TBCR
40.0000 meq | EXTENDED_RELEASE_TABLET | Freq: Once | ORAL | Status: AC
  Administered 2011-05-28: 40 meq via ORAL
  Filled 2011-05-28: qty 2

## 2011-05-28 MED FILL — Magnesium Sulfate Inj 50%: INTRAMUSCULAR | Qty: 10 | Status: AC

## 2011-05-28 MED FILL — Potassium Chloride Inj 2 mEq/ML: INTRAVENOUS | Qty: 40 | Status: AC

## 2011-05-28 MED FILL — Lactated Ringer's Solution: INTRAVENOUS | Qty: 500 | Status: AC

## 2011-05-28 MED FILL — Nitroglycerin IV Soln 5 MG/ML: INTRAVENOUS | Qty: 10 | Status: AC

## 2011-05-28 MED FILL — Verapamil HCl IV Soln 2.5 MG/ML: INTRAVENOUS | Qty: 4 | Status: AC

## 2011-05-28 MED FILL — Heparin Sodium (Porcine) Inj 1000 Unit/ML: INTRAMUSCULAR | Qty: 10 | Status: AC

## 2011-05-28 NOTE — Progress Notes (Addendum)
1 Day Post-Op Procedure(s) (LRB):  OFF PUMP CORONARY ARTERY BYPASS GRAFTING (CABG) (N/A)  left internal mammary artery to LAD   Subjective: Feels good - no CP or SOB.  Objective: Vital signs in last 24 hours: Temp:  [94.1 F (34.5 C)-100.2 F (37.9 C)] 98.6 F (37 C) (03/28 0743) Pulse Rate:  [79-91] 90  (03/28 0700) Resp:  [12-25] 15  (03/28 0700) BP: (85-121)/(46-80) 109/65 mmHg (03/28 0700) SpO2:  [91 %-99 %] 98 % (03/28 0700) Arterial Line BP: (87-152)/(50-84) 119/65 mmHg (03/28 0700) FiO2 (%):  [40 %-50 %] 40 % (03/27 1520) Weight:  [101.9 kg (224 lb 10.4 oz)] 101.9 kg (224 lb 10.4 oz) (03/28 0435) Weight change: 3.923 kg (8 lb 10.4 oz) Last BM Date: 05/26/11 Intake/Output from previous day: +2384  Wt. Up from 97.97 to 101.9 03/27 0701 - 03/28 0700 In: 5684.2 [P.O.:100; I.V.:4774.2; NG/GT:60; IV Piggyback:750] Out: 3260 [Urine:2565; Emesis/NG output:125; Blood:50; Chest Tube:520] Intake/Output this shift: Total I/O In: 220 [I.V.:20; IV Piggyback:200] Out: 200 [Urine:100; Chest Tube:100]   BP low during the night, improved this am.  PAP 32/21  CO= 5.5;  CI - 2.7 PE: General appearance: alert, cooperative, no distress and Pleasant Neck: no adenopathy, no carotid bruit, no JVD, supple, symmetrical, trachea midline, thyroid not enlarged, symmetric, no tenderness/mass/nodules and RIJ line just removed, dressing intact. Lungs: diminished breath sounds bibasilar Heart: regular rate and rhythm and friction rub heard across precordium, occasional ectopy noted. Abdomen: soft, non-tender; bowel sounds normal; no masses,  no organomegaly Extremities: extremities normal, atraumatic, no cyanosis or edema Pulses: 2+ and symmetric Neurologic: Grossly normal  Lab Results:  Basename 05/28/11 0400 05/27/11 1825  WBC 12.0* 12.8*  HGB 13.3 13.2  HCT 38.6* 38.5*  PLT 167 181   BMET  Basename 05/28/11 0400 05/27/11 1825 05/27/11 1811 05/26/11 2057  NA 137 -- 140 --  K 4.1 -- 4.8  --  CL 105 -- 108 --  CO2 24 -- -- 27  GLUCOSE 108* -- 108* --  BUN 13 -- 13 --  CREATININE 0.72 0.67 -- --  CALCIUM 8.4 -- -- 9.3   EKG: NSR with PACs, mild diffuse STE in limb leads  Studies/Results: Dg Chest Portable 1 View In Am  05/28/2011  *RADIOLOGY REPORT*  Clinical Data: There status post CABG.  Evaluate Swan-Ganz catheter and chest tube.  PORTABLE CHEST - 1 VIEW  Comparison: Chest x-ray 05/27/2011.  Findings: Endotracheal tube and nasogastric tubes have been removed.  Left-sided chest tube remains in position with tip inside port projecting over the left hemithorax.  Swan-Ganz catheter has been retracted slightly, now with tip in the right main pulmonary artery.  Mediastinal drain remains in place.  Lung volumes remain low.  There continue to be bibasilar opacities favored to represent areas of postoperative subsegmental atelectasis.  Small left-sided pleural effusion has slightly increased compared to yesterday's examination.  Heart size is normal.  Mediastinal contours are unremarkable.  Status post median sternotomy.  Orthopedic fixation hardware in the lower cervical spine.  IMPRESSION: 1.  Support apparatus, as above. 2.  Slight interval increase in small left-sided pleural effusion. 3.  Persistent bibasilar opacities favored to represent postoperative atelectasis.  Original Report Authenticated By: Florencia Reasons, M.D.   Dg Chest Portable 1 View  05/27/2011  *RADIOLOGY REPORT*  Clinical Data: Bypass surgery.  PORTABLE CHEST - 1 VIEW  Comparison: 05/21/2011.  Findings: The endotracheal tube is 2.7 cm above the carina.  The NG tube is coursing down the  esophagus and into the stomach.  A left- sided chest tube is in good position.  No pneumothorax.  The right IJ Swan-Ganz catheter tip is in the right pulmonary artery.  Low lung volumes with vascular crowding, atelectasis and mild vascular congestion.  No overt pulmonary edema or definite pleural effusions.  IMPRESSION:  1.   Postoperative support apparatus in good position without complicating features. 2.  Low lung volumes with vascular crowding, vascular congestion and areas of atelectasis.  Original Report Authenticated By: P. Loralie Champagne, M.D.    Medications: I have reviewed the patient's current medications.    Marland Kitchen acetaminophen (TYLENOL) oral liquid 160 mg/5 mL  650 mg Per Tube NOW   Or  . acetaminophen  650 mg Rectal NOW  . acetaminophen  1,000 mg Oral Q6H   Or  . acetaminophen (TYLENOL) oral liquid 160 mg/5 mL  975 mg Per Tube Q6H  . aspirin EC  325 mg Oral Daily   Or  . aspirin  324 mg Per Tube Daily  . bisacodyl  10 mg Oral Daily   Or  . bisacodyl  10 mg Rectal Daily  . dexmedetomidine (PRECEDEX) IV infusion for high rates  0.1-0.7 mcg/kg/hr Intravenous To OR  . docusate sodium  200 mg Oral Daily  . enoxaparin  40 mg Subcutaneous QHS  . famotidine (PEPCID) IV  20 mg Intravenous Q12H  . furosemide  40 mg Intravenous Once  . insulin aspart  0-24 Units Subcutaneous Q2H   Followed by  . insulin aspart  0-24 Units Subcutaneous Q4H  . insulin (NOVOLIN-R) infusion   Intravenous To OR  . ketorolac  30 mg Intravenous Q6H  . magnesium sulfate  4 g Intravenous Once  . metoprolol tartrate  12.5 mg Oral BID   Or  . metoprolol tartrate  12.5 mg Per Tube BID  . moxifloxacin  400 mg Intravenous To OR  . moxifloxacin  400 mg Intravenous Q24H  . nitroGLYCERIN  2-200 mcg/min Intravenous To OR  . nitroglycerin-verapamil-HEPARIN-sodium bicarbonate irrigation for artery spasm   Irrigation To OR  . pantoprazole  40 mg Oral Q1200  . potassium chloride  10 mEq Intravenous Q1 Hr x 3  . potassium chloride  10 mEq Intravenous Q1 Hr x 3  . simvastatin  40 mg Oral QPM  . sodium chloride  3 mL Intravenous Q12H  . vancomycin (VANCOCIN) IVPB 1000 mg/100 mL central line  1,000 mg Intravenous Once  . vancomycin  1,500 mg Intravenous To OR   Assessment/Plan: Patient Active Problem List  Diagnoses  . Angina at rest    . Abnormal nuclear stress test  . CAD (coronary artery disease)critical ostial LAD stenosis--undergoing CABG 05/27/11  off pump procedure  LIMA to LAD  . Tobacco abuse  . Dyslipidemia   PLAN:  POST OP Day 1  Off pressors to transfer to 2000 today.   LOS: 2 days   INGOLD,LAURA R 05/28/2011, 8:53 AM Pt seen & examined (my exam above).  Agree with remainder of note by Nada Boozer, NP.  Looks great POD #1 s/p LIMA-LAD CABG.  Progressing well -- on low dose BB, titrate up as BP tolerates off pressors with PACs noted. Anticipate transfer to Tele today. >2L+ from operation -- continue diuresis.  RIJ line & CT out this AM.   Marykay Lex, M.D., M.S. [facility] [address] [place], [place]  [address]  [phone] Pager # [phone]  05/28/2011 9:03 AM

## 2011-05-28 NOTE — Progress Notes (Signed)
Brief nursing progress:  Chest tubes and lines debrided as per md order without complication.  IV potasium irritating patients IV site, flushed per protocol, site changed and irritation continues.  Will advise MD and ask for P.O. Substitute.  Patient stable, up in chair, bathed and tolerating P.O.s without complication.  Diet advanced per protocol.

## 2011-05-28 NOTE — Progress Notes (Signed)
UR Completed.  Tammye Kahler Jane [phone] 05/28/2011  

## 2011-05-28 NOTE — Progress Notes (Signed)
1 Day Post-Op Procedure(s) (LRB): OFF PUMP CORONARY ARTERY BYPASS GRAFTING (CABG) (N/A) Subjective: Denies pain, wants to get OOB  Objective: Vital signs in last 24 hours: Temp:  [94.1 F (34.5 C)-100.2 F (37.9 C)] 98.6 F (37 C) (03/28 0743) Pulse Rate:  [79-91] 90  (03/28 0700) Cardiac Rhythm:  [-] Atrial paced (03/28 0719) Resp:  [12-25] 15  (03/28 0700) BP: (85-121)/(46-80) 109/65 mmHg (03/28 0700) SpO2:  [91 %-99 %] 98 % (03/28 0700) Arterial Line BP: (87-152)/(50-84) 119/65 mmHg (03/28 0700) FiO2 (%):  [40 %-50 %] 40 % (03/27 1520) Weight:  [224 lb 10.4 oz (101.9 kg)] 224 lb 10.4 oz (101.9 kg) (03/28 0435)  Hemodynamic parameters for last 24 hours: PAP: (19-41)/(11-24) 32/21 mmHg CO:  [3.7 L/min-5.5 L/min] 5.5 L/min CI:  [1.9 L/min/m2-2.7 L/min/m2] 2.7 L/min/m2  Intake/Output from previous day: 03/27 0701 - 03/28 0700 In: 5684.2 [P.O.:100; I.V.:4774.2; NG/GT:60; IV Piggyback:750] Out: 3260 [Urine:2565; Emesis/NG output:125; Blood:50; Chest Tube:520] Intake/Output this shift: Total I/O In: 220 [I.V.:20; IV Piggyback:200] Out: 200 [Urine:100; Chest Tube:100]  General appearance: alert and no distress Neurologic: intact Heart: regular rate and rhythm Lungs: diminished breath sounds bibasilar Abdomen: normal findings: soft, non-tender  Lab Results:  Basename 05/28/11 0400 05/27/11 1825  WBC 12.0* 12.8*  HGB 13.3 13.2  HCT 38.6* 38.5*  PLT 167 181   BMET:  Basename 05/28/11 0400 05/27/11 1825 05/27/11 1811 05/26/11 2057  NA 137 -- 140 --  K 4.1 -- 4.8 --  CL 105 -- 108 --  CO2 24 -- -- 27  GLUCOSE 108* -- 108* --  BUN 13 -- 13 --  CREATININE 0.72 0.67 -- --  CALCIUM 8.4 -- -- 9.3    PT/INR:  Basename 05/27/11 1245  LABPROT 16.4*  INR 1.30   ABG    Component Value Date/Time   PHART 7.359 05/27/2011 1808   HCO3 23.8 05/27/2011 1808   TCO2 23 05/27/2011 1811   ACIDBASEDEF 2.0 05/27/2011 1808   O2SAT 95.0 05/27/2011 1808   CBG (last 3)   Basename  05/28/11 0401 05/27/11 2337 05/27/11 1909  GLUCAP 103* 96 97    Assessment/Plan: S/P Procedure(s) (LRB): OFF PUMP CORONARY ARTERY BYPASS GRAFTING (CABG) (N/A) Plan for transfer to step-down: see transfer orders POD #1 Doing well  CV- hemodynamics stable, good CO, ECG shows diffuse j pt elevation, likely pericarditis  D/c swan  RESP- pulmonary toilet  RENAL- lytes, cr OK- diurese  ENDO- No DM, CBG normal  D/C CT  Transfer to 2000      LOS: 2 days    [name] C 05/28/2011

## 2011-05-28 NOTE — Op Note (Signed)
NAMEBASSAM, DRESCH          ACCOUNT NO.:  [id number]  MEDICAL RECORD NO.:  [id number]  LOCATION:  2315                         FACILITY:  [facility]  PHYSICIAN:  Salvatore Decent. Dorris Fetch, M.D.DATE OF BIRTH:  08-Aug-1950  DATE OF PROCEDURE:  05/27/2011 DATE OF DISCHARGE:                              OPERATIVE REPORT   PREOPERATIVE DIAGNOSIS:  Severe single-vessel coronary artery disease with exertional angina.  POSTOPERATIVE DIAGNOSIS:  Severe single-vessel coronary artery disease with exertional angina.  PROCEDURE:  Median sternotomy, off-pump coronary bypass grafting x1 (left internal mammary artery to LAD).  SURGEON:  Salvatore Decent. Dorris Fetch, M.D.  ASSISTANT:  Rowe Clack, P.A.-C  ANESTHESIA:  General.  FINDINGS:  Good quality conduits, good quality targets.  CLINICAL NOTE:  Mr. Bagheri is a 61 year old gentleman, who presented with new-onset exertional chest discomfort.  He had a markedly positive nuclear stress test and a cardiac catheterization had a critical ostial LAD stenosis.  This was not amenable to percutaneous intervention.  He was offered coronary artery bypass grafting for treatment of a single vessel coronary disease.  The indications, risks, benefits, and alternatives were discussed in detail with the patient.  He understood and accepted the risks and agreed to proceed.  OPERATIVE NOTE:  Mr. Posten was brought to the preop holding area on May 27, 2011.  There, the anesthesia service placed a Swan-Ganz catheter and arterial blood pressure monitoring line.  Intravenous antibiotics were administered.  He was taken to the operating room, anesthetized, and intubated.  A Foley catheter was placed.  The chest, abdomen, and legs were prepped and draped in usual sterile fashion.  A median sternotomy was performed.  The left internal mammary artery was harvested using standard technique.  The patient was heparinized prior to dividing the distal end of the  mammary artery.  Anticoagulation was monitored with ACT measurement.  The sternal retractor then was placed.  The pericardium was opened.  The patient was placed in Trendelenburg position.  The heart was lifted to allow placement of two deep pericardial stay sutures.  Traction was placed on the stay sutures which brought the apex of the heart up anteriorly.  The Maquet off pump stabilizer system was utilized.  The apical suction cup was placed on the apex of the heart to stabilize it and the actual stabilizer itself was placed at the site selected for anastomosis on the LAD.  The LAD was intramyocardial proximal to the anastomosis.  The LAD was a 1.5 mm good quality target vessel.  Silastic tapes were placed proximally and distally in a Potts tie fashion around the LAD proximal and distal to the anastomosis.  The left internal mammary artery was prepared by beveling at the distal end of the vessel. An arteriotomy was made in the LAD, traction was placed on the proximal distal silastic tapes.  The arteriotomy was extended proximally and distally at 1.5 mm probe did pass both proximally and distally.  The mammary to LAD anastomosis then was performed with a running 8-0 Prolene suture in an end-to-side fashion.  At completion of anastomosis, the tapes were first released from the traction on the LAD, a single 8-0 Prolene suture was used to correct the dog ear at the  anastomosis.  The bulldog clamp then was removed from the left mammary artery.  There was good hemostasis at the anastomosis.  The mammary pedicle was tacked to the epicardial surface of the heart with 6-0 Prolene sutures.  Test dose protamine was administered, it was well tolerated.  The remainder of the protamine was administered without incident.  The epicardial pacemaker wires were placed on the epicardial surface of the right ventricle and right atrium.  Chest was copiously irrigated with warm saline.  The pericardium was  reapproximated with interrupted 3-0 silk sutures.  It came together easily without tension.  The left pleural and single mediastinal chest tubes were placed in separate subcostal incisions.  The sternum was closed with a combination of single and double heavy gauge stainless steel wires.  Pectoralis fascia, subcutaneous tissue, and skin were closed in standard fashion.  All sponge, needle, instrument counts were correct at the end of the procedure.  The patient was taken from the operating room to the surgical intensive care unit in good condition.     Salvatore Decent Dorris Fetch, M.D.     SCH/MEDQ  D:  05/27/2011  T:  05/28/2011  Job:  [id number]

## 2011-05-29 ENCOUNTER — Inpatient Hospital Stay (HOSPITAL_COMMUNITY): Payer: Medicaid Other

## 2011-05-29 LAB — BASIC METABOLIC PANEL
BUN: 18 mg/dL (ref 6–23)
CO2: 26 mEq/L (ref 19–32)
Calcium: 8.8 mg/dL (ref 8.4–10.5)
Chloride: 103 mEq/L (ref 96–112)
Creatinine, Ser: 0.84 mg/dL (ref 0.50–1.35)
GFR calc Af Amer: >90 mL/min (ref >90)
GFR calc non Af Amer: >90 mL/min (ref >90)
Glucose, Bld: 99 mg/dL (ref 70–99)
Potassium: 4 mEq/L (ref 3.5–5.1)
Sodium: 138 mEq/L (ref 135–145)

## 2011-05-29 LAB — CBC
HCT: 41.5 % (ref 39.0–52.0)
Hemoglobin: 14.2 g/dL (ref 13.0–17.0)
MCH: 29.6 pg (ref 26.0–34.0)
MCHC: 34.2 g/dL (ref 30.0–36.0)
MCV: 86.6 fL (ref 78.0–100.0)
Platelets: 174 10*3/uL (ref 150–400)
RBC: 4.79 MIL/uL (ref 4.22–5.81)
RDW: 14.3 % (ref 11.5–15.5)
WBC: 14 10*3/uL — ABNORMAL HIGH (ref 4.0–10.5)

## 2011-05-29 LAB — GLUCOSE, CAPILLARY
Glucose-Capillary: 93 mg/dL (ref 70–99)
Glucose-Capillary: 101 mg/dL — ABNORMAL HIGH (ref 70–99)

## 2011-05-29 MED ORDER — METOPROLOL SUCCINATE ER 50 MG PO TB24
50.0000 mg | ORAL_TABLET | Freq: Every day | ORAL | Status: DC
  Administered 2011-05-29 – 2011-05-30 (×2): 50 mg via ORAL
  Filled 2011-05-29 (×3): qty 1

## 2011-05-29 MED FILL — Aminocaproic Acid Inj 250 MG/ML: INTRAVENOUS | Qty: 40 | Status: AC

## 2011-05-29 MED FILL — Sodium Chloride IV Soln 0.9%: INTRAVENOUS | Qty: 100 | Status: AC

## 2011-05-29 NOTE — Progress Notes (Signed)
[id number] Cardiac Rehab Pt has walked several times today long distances, denies any problems.Completed discharge education with pt. He agrees to [employer]. CRP in [place], will send referral.

## 2011-05-29 NOTE — Progress Notes (Signed)
Subjective:  No CP/SOB  Objective:  Temp:  [97.5 F (36.4 C)-98.5 F (36.9 C)] 98.5 F (36.9 C) (03/29 0735) Pulse Rate:  [57-112] 76  (03/29 0700) Resp:  [14-22] 17  (03/29 0700) BP: (85-146)/(45-93) 128/92 mmHg (03/29 0700) SpO2:  [90 %-97 %] 94 % (03/29 0700) Weight:  [99.292 kg (218 lb 14.4 oz)] 99.292 kg (218 lb 14.4 oz) (03/29 0500) Weight change: -2.608 kg (-5 lb 12 oz)  Intake/Output from previous day: 03/28 0701 - 03/29 0700 In: 758 [P.O.:480; I.V.:49; IV Piggyback:229] Out: 2905 [Urine:2805; Chest Tube:100]  Intake/Output from this shift:    Physical Exam: General appearance: alert and cooperative Neck: no adenopathy, no carotid bruit, no JVD, supple, symmetrical, trachea midline and thyroid not enlarged, symmetric, no tenderness/mass/nodules Lungs: clear to auscultation bilaterally Heart: regular rate and rhythm, S1, S2 normal, no murmur, click, rub or gallop Extremities: extremities normal, atraumatic, no cyanosis or edema  Lab Results: Results for orders placed during the hospital encounter of 05/26/11 (from the past 48 hour(s))  POCT I-STAT 4, (NA,K, GLUC, HGB,HCT)     Status: Normal   Collection Time   05/27/11  9:06 AM      Component Value Range Comment   Sodium 141  135 - 145 (mEq/L)    Potassium 4.2  3.5 - 5.1 (mEq/L)    Glucose, Bld 88  70 - 99 (mg/dL)    HCT 16.1  09.6 - 04.5 (%)    Hemoglobin 13.6  13.0 - 17.0 (g/dL)   POCT I-STAT 4, (NA,K, GLUC, HGB,HCT)     Status: Abnormal   Collection Time   05/27/11 10:25 AM      Component Value Range Comment   Sodium 140  135 - 145 (mEq/L)    Potassium 4.1  3.5 - 5.1 (mEq/L)    Glucose, Bld 96  70 - 99 (mg/dL)    HCT 40.9 (*) 81.1 - 52.0 (%)    Hemoglobin 12.6 (*) 13.0 - 17.0 (g/dL)   POCT I-STAT 4, (NA,K, GLUC, HGB,HCT)     Status: Abnormal   Collection Time   05/27/11 11:27 AM      Component Value Range Comment   Sodium 140  135 - 145 (mEq/L)    Potassium 4.0  3.5 - 5.1 (mEq/L)    Glucose, Bld 108 (*)  70 - 99 (mg/dL)    HCT 91.4 (*) 78.2 - 52.0 (%)    Hemoglobin 12.9 (*) 13.0 - 17.0 (g/dL)   CBC     Status: Abnormal   Collection Time   05/27/11 12:45 PM      Component Value Range Comment   WBC 9.1  4.0 - 10.5 (K/uL)    RBC 4.36  4.22 - 5.81 (MIL/uL)    Hemoglobin 13.0  13.0 - 17.0 (g/dL)    HCT 95.6 (*) 21.3 - 52.0 (%)    MCV 85.8  78.0 - 100.0 (fL)    MCH 29.8  26.0 - 34.0 (pg)    MCHC 34.8  30.0 - 36.0 (g/dL)    RDW 08.6  57.8 - 46.9 (%)    Platelets 176  150 - 400 (K/uL) REPEATED TO VERIFY  PROTIME-INR     Status: Abnormal   Collection Time   05/27/11 12:45 PM      Component Value Range Comment   Prothrombin Time 16.4 (*) 11.6 - 15.2 (seconds)    INR 1.30  0.00 - 1.49    APTT     Status: Normal   Collection  Time   05/27/11 12:45 PM      Component Value Range Comment   aPTT 35  24 - 37 (seconds)   POCT I-STAT 3, BLOOD GAS (G3+)     Status: Abnormal   Collection Time   05/27/11 12:45 PM      Component Value Range Comment   pH, Arterial 7.441  7.350 - 7.450     pCO2 arterial 33.8 (*) 35.0 - 45.0 (mmHg)    pO2, Arterial 58.0 (*) 80.0 - 100.0 (mmHg)    Bicarbonate 23.6  20.0 - 24.0 (mEq/L)    TCO2 25  0 - 100 (mmol/L)    O2 Saturation 93.0      Acid-base deficit 1.0  0.0 - 2.0 (mmol/L)    Patient temperature 34.5 C      Collection site ARTERIAL LINE      Drawn by Operator      Sample type ARTERIAL     POCT I-STAT 4, (NA,K, GLUC, HGB,HCT)     Status: Abnormal   Collection Time   05/27/11 12:50 PM      Component Value Range Comment   Sodium 141  135 - 145 (mEq/L)    Potassium 3.8  3.5 - 5.1 (mEq/L)    Glucose, Bld 108 (*) 70 - 99 (mg/dL)    HCT 78.2 (*) 95.6 - 52.0 (%)    Hemoglobin 12.6 (*) 13.0 - 17.0 (g/dL)   GLUCOSE, CAPILLARY     Status: Normal   Collection Time   05/27/11  1:19 PM      Component Value Range Comment   Glucose-Capillary 98  70 - 99 (mg/dL)   GLUCOSE, CAPILLARY     Status: Normal   Collection Time   05/27/11  2:00 PM      Component Value Range  Comment   Glucose-Capillary 97  70 - 99 (mg/dL)   GLUCOSE, CAPILLARY     Status: Abnormal   Collection Time   05/27/11  3:48 PM      Component Value Range Comment   Glucose-Capillary 103 (*) 70 - 99 (mg/dL)   POCT I-STAT 3, BLOOD GAS (G3+)     Status: Normal   Collection Time   05/27/11  3:50 PM      Component Value Range Comment   pH, Arterial 7.357  7.350 - 7.450     pCO2 arterial 41.8  35.0 - 45.0 (mmHg)    pO2, Arterial 80.0  80.0 - 100.0 (mmHg)    Bicarbonate 23.5  20.0 - 24.0 (mEq/L)    TCO2 25  0 - 100 (mmol/L)    O2 Saturation 95.0      Acid-base deficit 2.0  0.0 - 2.0 (mmol/L)    Patient temperature 37.0 C      Collection site RADIAL, ALLEN'S TEST ACCEPTABLE      Drawn by Nurse      Sample type ARTERIAL     GLUCOSE, CAPILLARY     Status: Abnormal   Collection Time   05/27/11  6:05 PM      Component Value Range Comment   Glucose-Capillary 101 (*) 70 - 99 (mg/dL)   POCT I-STAT 3, BLOOD GAS (G3+)     Status: Normal   Collection Time   05/27/11  6:08 PM      Component Value Range Comment   pH, Arterial 7.359  7.350 - 7.450     pCO2 arterial 42.4  35.0 - 45.0 (mmHg)    pO2, Arterial 82.0  80.0 - 100.0 (  mmHg)    Bicarbonate 23.8  20.0 - 24.0 (mEq/L)    TCO2 25  0 - 100 (mmol/L)    O2 Saturation 95.0      Acid-base deficit 2.0  0.0 - 2.0 (mmol/L)    Patient temperature 37.6 C      Collection site RADIAL, ALLEN'S TEST ACCEPTABLE      Drawn by Nurse      Sample type ARTERIAL     POCT I-STAT, CHEM 8     Status: Abnormal   Collection Time   05/27/11  6:11 PM      Component Value Range Comment   Sodium 140  135 - 145 (mEq/L)    Potassium 4.8  3.5 - 5.1 (mEq/L)    Chloride 108  96 - 112 (mEq/L)    BUN 13  6 - 23 (mg/dL)    Creatinine, Ser 4.09  0.50 - 1.35 (mg/dL)    Glucose, Bld 811 (*) 70 - 99 (mg/dL)    Calcium, Ion 9.14  1.12 - 1.32 (mmol/L)    TCO2 23  0 - 100 (mmol/L)    Hemoglobin 13.6  13.0 - 17.0 (g/dL)    HCT 78.2  95.6 - 21.3 (%)   CBC     Status: Abnormal    Collection Time   05/27/11  6:25 PM      Component Value Range Comment   WBC 12.8 (*) 4.0 - 10.5 (K/uL)    RBC 4.53  4.22 - 5.81 (MIL/uL)    Hemoglobin 13.2  13.0 - 17.0 (g/dL)    HCT 08.6 (*) 57.8 - 52.0 (%)    MCV 85.0  78.0 - 100.0 (fL)    MCH 29.1  26.0 - 34.0 (pg)    MCHC 34.3  30.0 - 36.0 (g/dL)    RDW 46.9  62.9 - 52.8 (%)    Platelets 181  150 - 400 (K/uL)   CREATININE, SERUM     Status: Normal   Collection Time   05/27/11  6:25 PM      Component Value Range Comment   Creatinine, Ser 0.67  0.50 - 1.35 (mg/dL)    GFR calc non Af Amer >90  >90 (mL/min)    GFR calc Af Amer >90  >90 (mL/min)   MAGNESIUM     Status: Normal   Collection Time   05/27/11  6:25 PM      Component Value Range Comment   Magnesium 2.4  1.5 - 2.5 (mg/dL)   GLUCOSE, CAPILLARY     Status: Normal   Collection Time   05/27/11  7:09 PM      Component Value Range Comment   Glucose-Capillary 97  70 - 99 (mg/dL)    Comment 1 Documented in Chart      Comment 2 Notify RN     GLUCOSE, CAPILLARY     Status: Normal   Collection Time   05/27/11 11:37 PM      Component Value Range Comment   Glucose-Capillary 96  70 - 99 (mg/dL)   CBC     Status: Abnormal   Collection Time   05/28/11  4:00 AM      Component Value Range Comment   WBC 12.0 (*) 4.0 - 10.5 (K/uL)    RBC 4.50  4.22 - 5.81 (MIL/uL)    Hemoglobin 13.3  13.0 - 17.0 (g/dL)    HCT 41.3 (*) 24.4 - 52.0 (%)    MCV 85.8  78.0 - 100.0 (fL)  MCH 29.6  26.0 - 34.0 (pg)    MCHC 34.5  30.0 - 36.0 (g/dL)    RDW 40.9  81.1 - 91.4 (%)    Platelets 167  150 - 400 (K/uL)   BASIC METABOLIC PANEL     Status: Abnormal   Collection Time   05/28/11  4:00 AM      Component Value Range Comment   Sodium 137  135 - 145 (mEq/L)    Potassium 4.1  3.5 - 5.1 (mEq/L)    Chloride 105  96 - 112 (mEq/L)    CO2 24  19 - 32 (mEq/L)    Glucose, Bld 108 (*) 70 - 99 (mg/dL)    BUN 13  6 - 23 (mg/dL)    Creatinine, Ser 7.82  0.50 - 1.35 (mg/dL)    Calcium 8.4  8.4 - 10.5 (mg/dL)     GFR calc non Af Amer >90  >90 (mL/min)    GFR calc Af Amer >90  >90 (mL/min)   MAGNESIUM     Status: Normal   Collection Time   05/28/11  4:00 AM      Component Value Range Comment   Magnesium 2.2  1.5 - 2.5 (mg/dL)   GLUCOSE, CAPILLARY     Status: Abnormal   Collection Time   05/28/11  4:01 AM      Component Value Range Comment   Glucose-Capillary 103 (*) 70 - 99 (mg/dL)   GLUCOSE, CAPILLARY     Status: Normal   Collection Time   05/28/11  7:39 AM      Component Value Range Comment   Glucose-Capillary 94  70 - 99 (mg/dL)    Comment 1 Documented in Chart      Comment 2 Notify RN     GLUCOSE, CAPILLARY     Status: Normal   Collection Time   05/28/11 11:45 AM      Component Value Range Comment   Glucose-Capillary 93  70 - 99 (mg/dL)    Comment 1 Documented in Chart      Comment 2 Notify RN     GLUCOSE, CAPILLARY     Status: Abnormal   Collection Time   05/28/11  4:09 PM      Component Value Range Comment   Glucose-Capillary 105 (*) 70 - 99 (mg/dL)    Comment 1 Notify RN     GLUCOSE, CAPILLARY     Status: Abnormal   Collection Time   05/28/11  7:34 PM      Component Value Range Comment   Glucose-Capillary 176 (*) 70 - 99 (mg/dL)   GLUCOSE, CAPILLARY     Status: Abnormal   Collection Time   05/28/11 11:21 PM      Component Value Range Comment   Glucose-Capillary 101 (*) 70 - 99 (mg/dL)   BASIC METABOLIC PANEL     Status: Normal   Collection Time   05/29/11  4:00 AM      Component Value Range Comment   Sodium 138  135 - 145 (mEq/L)    Potassium 4.0  3.5 - 5.1 (mEq/L)    Chloride 103  96 - 112 (mEq/L)    CO2 26  19 - 32 (mEq/L)    Glucose, Bld 99  70 - 99 (mg/dL)    BUN 18  6 - 23 (mg/dL)    Creatinine, Ser 9.56  0.50 - 1.35 (mg/dL)    Calcium 8.8  8.4 - 10.5 (mg/dL)    GFR calc non Af  Amer >90  >90 (mL/min)    GFR calc Af Amer >90  >90 (mL/min)   CBC     Status: Abnormal   Collection Time   05/29/11  4:00 AM      Component Value Range Comment   WBC 14.0 (*) 4.0 -  10.5 (K/uL)    RBC 4.79  4.22 - 5.81 (MIL/uL)    Hemoglobin 14.2  13.0 - 17.0 (g/dL)    HCT 91.4  78.2 - 95.6 (%)    MCV 86.6  78.0 - 100.0 (fL)    MCH 29.6  26.0 - 34.0 (pg)    MCHC 34.2  30.0 - 36.0 (g/dL)    RDW 21.3  08.6 - 57.8 (%)    Platelets 174  150 - 400 (K/uL)     Imaging: Imaging results have been reviewed  Assessment/Plan:   1. Active Problems: 2.  Angina at rest 3.  Abnormal nuclear stress test 4.  CAD (coronary artery disease)critical ostial LAD stenosis--undergoing CABG 05/27/11  off pump procedure  LIMA to LAD 5.  Tobacco abuse 6.  Dyslipidemia 7.   Time Spent Directly with Patient:  20 minutes  Length of Stay:  LOS: 3 days   POD #2 CABG x1, off pump LIMA to LAD for critical ostial LAD disease in a Left dominant system. Looks great!! Ambulating w/o difficulty. Labs OK. VSS. Prob transfer to tele per TCTS and home tomorrow. F/U with Dr. Alanda Amass already scheduled.  Runell Gess 05/29/2011, 8:46 AM

## 2011-05-29 NOTE — Progress Notes (Signed)
2 Days Post-Op Procedure(s) (LRB): OFF PUMP CORONARY ARTERY BYPASS GRAFTING (CABG) (N/A) Subjective: No complaints Anxious to go home  Objective: Vital signs in last 24 hours: Temp:  [97.5 F (36.4 C)-98.5 F (36.9 C)] 98.5 F (36.9 C) (03/29 0735) Pulse Rate:  [57-112] 76  (03/29 0700) Cardiac Rhythm:  [-] Normal sinus rhythm (03/28 1935) Resp:  [14-22] 17  (03/29 0700) BP: (85-146)/(45-93) 128/92 mmHg (03/29 0700) SpO2:  [90 %-97 %] 94 % (03/29 0700) Arterial Line BP: (105)/(55) 105/55 mmHg (03/28 0800) Weight:  [218 lb 14.4 oz (99.292 kg)] 218 lb 14.4 oz (99.292 kg) (03/29 0500)  Hemodynamic parameters for last 24 hours: PAP: (24)/(13) 24/13 mmHg  Intake/Output from previous day: 03/28 0701 - 03/29 0700 In: 758 [P.O.:480; I.V.:49; IV Piggyback:229] Out: 2905 [Urine:2805; Chest Tube:100] Intake/Output this shift:    General appearance: alert and no distress Neurologic: intact Heart: regular rate and rhythm Lungs: clear to auscultation bilaterally Abdomen: normal findings: soft, non-tender Wound: intact  Lab Results:  Basename 05/29/11 0400 05/28/11 0400  WBC 14.0* 12.0*  HGB 14.2 13.3  HCT 41.5 38.6*  PLT 174 167   BMET:  Basename 05/29/11 0400 05/28/11 0400  NA 138 137  K 4.0 4.1  CL 103 105  CO2 26 24  GLUCOSE 99 108*  BUN 18 13  CREATININE 0.84 0.72  CALCIUM 8.8 8.4    PT/INR:  Basename 05/27/11 1245  LABPROT 16.4*  INR 1.30   ABG    Component Value Date/Time   PHART 7.359 05/27/2011 1808   HCO3 23.8 05/27/2011 1808   TCO2 23 05/27/2011 1811   ACIDBASEDEF 2.0 05/27/2011 1808   O2SAT 95.0 05/27/2011 1808   CBG (last 3)   Basename 05/28/11 2321 05/28/11 1934 05/28/11 1609  GLUCAP 101* 176* 105*    Assessment/Plan: S/P Procedure(s) (LRB): OFF PUMP CORONARY ARTERY BYPASS GRAFTING (CABG) (N/A) Plan for transfer to step-down: see transfer orders Awaiting 2000 bed CV- stable- change lopressor to toprol RESP- IS RENAL- lytes, creatinine  OK CBG OK- d/c CBG/ SSI Probably home in AM   LOS: 3 days    [name] C 05/29/2011

## 2011-05-29 NOTE — Consult Note (Signed)
Pt smokes 1/2 -1 ppd. He is very eager to quit and in action stage. States that he plans to quit cold [place]. Encouraged pt to quit. Discussed options available like medical aids to help with quitting if he's unable to quit cold [place]. Pt verbalizes understanding. Referred to 1-800 quit now for f/u and support. Discussed oral fixation substitutes, second hand smoke and in home smoking policy. Reviewed and gave pt Written education/contact information.

## 2011-05-30 MED ORDER — ASPIRIN 325 MG PO TBEC: 325.0000 mg | DELAYED_RELEASE_TABLET | Freq: Every day | ORAL | Status: DC

## 2011-05-30 MED ORDER — OXYCODONE HCL 5 MG PO TABS: 5.0000 mg | ORAL_TABLET | ORAL | Status: AC | PRN

## 2011-05-30 NOTE — Progress Notes (Signed)
Pt IV removed and no bleeding noted. Discharge instructions and prescriptions given to pt. Pt verbalized understanding. Will assist pt to car ride home.

## 2011-05-30 NOTE — Discharge Summary (Signed)
[address]            [place] [address]          [phone]      [name] Oct 09, 1950 61 y.o. [id number]  05/26/2011   Loreli Slot, MD  chest pain chest pain, abnormal NST CAD  [name] is an 61 y.o. male.  HPI:60 yo WM with no prior history of CAD. First noted chest discomfort near the end of last summer after mowing his yard. Lasted minutes and resolved with rest. Did well until a couple of weeks ago when he again noted exertional chest discomfort. He describes this as an aching/ pressure type sensation. He had a stress test which showed a large area of the anteroapical wall and septum at risk. Cardiac cath today revealed critical stenosis at the ostium of the LAD. He did not have any other hemodynamically significant disease. He was seen in cardiothoracic surgical consultation by Dr. Charlett Lango and coronary artery bypass grafting was recommended. History reviewed. No pertinent family history.  Social History: reports that he has been smoking Cigarettes. He has a 35 pack-year smoking history. He has never used smokeless tobacco. He reports that he does not drink alcohol or use illicit drugs.  Allergies:  Allergies   Allergen  Reactions   .  Penicillins  Other (See Comments)     unknown    Medications:  I have reviewed the patient's current medications.  Prior to Admission:  Prescriptions prior to admission   Medication  Sig  Dispense  Refill   .  aspirin EC 81 MG tablet  Take 162 mg by mouth daily.     .  metoprolol succinate (TOPROL-XL) 50 MG 24 hr tablet  Take 50 mg by mouth daily. Take with or immediately following a meal.     .  Omega-3 Fatty Acids (FISH OIL PO)  Take 1 capsule by mouth 3 (three) times daily.     Marland Kitchen  oxyCODONE-acetaminophen (PERCOCET) 10-325 MG per tablet  Take 1 tablet by mouth daily.     .  pantoprazole (PROTONIX) 40 MG tablet  Take 40 mg by mouth daily.     .  ramipril (ALTACE) 5 MG  capsule  Take 5 mg by mouth daily.     .  simvastatin (ZOCOR) 40 MG tablet  Take 40 mg by mouth every evening.     .  nitroGLYCERIN (NITROSTAT) 0.4 MG SL tablet  Place 0.4 mg under the tongue every 5 (five) minutes as needed. For chest pain      Review of Systems : At time of consultation Constitutional: Negative for fever, chills, weight loss and malaise/fatigue.  Respiratory: Negative for shortness of breath and wheezing.  Cardiovascular: Positive for chest pain. Negative for claudication, leg swelling and PND.  Gastrointestinal: Negative.  Genitourinary: Negative.  Neurological: Positive for sensory change, focal weakness (right leg numbness and weakness, walks with cane) and weakness. Negative for speech change.  Endo/Heme/Allergies: Does not bruise/bleed easily.  All other systems reviewed and are negative.   Blood pressure 162/84, pulse 56, temperature 97.6 F (36.4 C), temperature source Oral, resp. rate 18, height 5\' 4"  (1.626 m), weight 216 lb (97.977 kg), SpO2 97.00%.  Physical Exam : At time of consultation Vitals reviewed.  Constitutional: He is oriented to person, place, and time. He appears well-developed and well-nourished.  HENT:  Head: Normocephalic  and atraumatic.  Eyes: EOM are normal. Pupils are equal, round, and reactive to light.  Neck: Neck supple. No JVD present. No thyromegaly present.  Cardiovascular: Normal rate, regular rhythm, normal heart sounds and intact distal pulses. Exam reveals no gallop and no friction rub.  No murmur heard.  Respiratory: Effort normal and breath sounds normal. He has no wheezes. He has no rales.  GI: Soft. There is no tenderness.  Musculoskeletal: He exhibits no edema.  Lymphadenopathy:  He has no cervical adenopathy.  Neurological: He is alert and oriented to person, place, and time. No cranial nerve deficit.  Skin: Skin is warm and dry.   The patient was felt to be stable for proceeding with surgery and on 05/27/2011 he  underwent the following procedure:  OPERATIVE REPORT  PREOPERATIVE DIAGNOSIS: Severe single-vessel coronary artery disease  with exertional angina.  POSTOPERATIVE DIAGNOSIS: Severe single-vessel coronary artery disease  with exertional angina.  PROCEDURE: Median sternotomy, off-pump coronary bypass grafting x1  (left internal mammary artery to LAD).  SURGEON: Salvatore Decent. Dorris Fetch, M.D.  ASSISTANT: Rowe Clack, P.A.-C  ANESTHESIA: General.  FINDINGS: Good quality conduits, good quality targets. The patient was taken from the operating room to the surgical intensive care unit in good condition.  Postoperative hospital course:  Patient has done quite well. He was weaned from the ventilator without difficulty. All routine lines, monitors, and drainage devices have been discontinued in the standard fashion. He had a mild volume overload but responded well to diuretics. He is tolerating gradually increasing activities using standard protocols. Cardiac rhythm is stable with no significant dysrhythmias. He has a mild postoperative change in his hemoglobin and hematocrit which have improved over time with diuresis. Incision is healing well without evidence of infection. He is tolerating diet. Oxygen has been weaned and he maintains good saturations on room air. His overall felt to be stable for discharge on today's date.  Basename 05/29/11 0400 05/28/11 0400  WBC 14.0* 12.0*  HGB 14.2 13.3  HCT 41.5 38.6*  PLT 174 167   No results found for this basename: INR:2 in the last 72 hours   Discharge Instructions:  The patient is discharged to home with extensive instructions on wound care and progressive ambulation.  They are instructed not to drive or perform any heavy lifting until returning to see the physician in his office.  Discharge Diagnosis:  chest pain chest pain, abnormal NST CAD  Secondary Diagnosis: Patient Active Problem List  Diagnoses  . Angina at rest  . Abnormal nuclear  stress test  . CAD (coronary artery disease)critical ostial LAD stenosis--undergoing CABG 05/27/11  off pump procedure  LIMA to LAD  . Tobacco abuse  . Dyslipidemia   Past Medical History  Diagnosis Date  . Chest pain   . Coronary artery disease   . Hypertension   . Arthritis   . Smoker   . Angina at rest 05/28/2011  . Abnormal nuclear stress test 05/28/2011  . Tobacco abuse 05/28/2011  . Dyslipidemia 05/28/2011       [place], [facility]  Home Medication Instructions FAO:[id number]   Printed on:05/30/11 1457  Medication Information                    metoprolol succinate (TOPROL-XL) 50 MG 24 hr tablet Take 50 mg by mouth daily. Take with or immediately following a meal.           pantoprazole (PROTONIX) 40 MG tablet Take 40 mg by mouth daily.  ramipril (ALTACE) 5 MG capsule Take 5 mg by mouth daily.           simvastatin (ZOCOR) 40 MG tablet Take 40 mg by mouth every evening.           Omega-3 Fatty Acids (FISH OIL PO) Take 1 capsule by mouth 3 (three) times daily.           aspirin EC 325 MG EC tablet Take 1 tablet (325 mg total) by mouth daily.           oxyCODONE (OXY IR/ROXICODONE) 5 MG immediate release tablet Take 1-2 tablets (5-10 mg total) by mouth every 4 (four) hours as needed for pain.             Disposition: For discharge home Patient's condition is Good  Gershon Crane, PA-C 05/30/2011  2:57 PM

## 2011-05-30 NOTE — Progress Notes (Signed)
Subjective:  No CP/SOB  Objective:  Temp:  [97.8 F (36.6 C)-98.5 F (36.9 C)] 98.4 F (36.9 C) (03/30 0511) Pulse Rate:  [68-92] 68  (03/30 0511) Resp:  [16-18] 16  (03/30 0511) BP: (114-158)/(71-81) 134/79 mmHg (03/30 0511) SpO2:  [93 %-96 %] 93 % (03/30 0511) Weight:  [97.932 kg (215 lb 14.4 oz)] 97.932 kg (215 lb 14.4 oz) (03/30 0511) Weight change: -1.361 kg (-3 lb)  Intake/Output from previous day: 03/29 0701 - 03/30 0700 In: 480 [P.O.:480] Out: 1600 [Urine:1600]  Intake/Output from this shift:    Physical Exam: General appearance: alert and cooperative Neck: no adenopathy, no carotid bruit, no JVD, supple, symmetrical, trachea midline and thyroid not enlarged, symmetric, no tenderness/mass/nodules Lungs: clear to auscultation bilaterally Heart: regular rate and rhythm, S1, S2 normal, no murmur, click, rub or gallop Extremities: extremities normal, atraumatic, no cyanosis or edema  Lab Results: Results for orders placed during the hospital encounter of 05/26/11 (from the past 48 hour(s))  GLUCOSE, CAPILLARY     Status: Normal   Collection Time   05/28/11 11:45 AM      Component Value Range Comment   Glucose-Capillary 93  70 - 99 (mg/dL)    Comment 1 Documented in Chart      Comment 2 Notify RN     GLUCOSE, CAPILLARY     Status: Abnormal   Collection Time   05/28/11  4:09 PM      Component Value Range Comment   Glucose-Capillary 105 (*) 70 - 99 (mg/dL)    Comment 1 Notify RN     GLUCOSE, CAPILLARY     Status: Abnormal   Collection Time   05/28/11  7:34 PM      Component Value Range Comment   Glucose-Capillary 176 (*) 70 - 99 (mg/dL)   GLUCOSE, CAPILLARY     Status: Abnormal   Collection Time   05/28/11 11:21 PM      Component Value Range Comment   Glucose-Capillary 101 (*) 70 - 99 (mg/dL)   BASIC METABOLIC PANEL     Status: Normal   Collection Time   05/29/11  4:00 AM      Component Value Range Comment   Sodium 138  135 - 145 (mEq/L)    Potassium 4.0  3.5  - 5.1 (mEq/L)    Chloride 103  96 - 112 (mEq/L)    CO2 26  19 - 32 (mEq/L)    Glucose, Bld 99  70 - 99 (mg/dL)    BUN 18  6 - 23 (mg/dL)    Creatinine, Ser 1.19  0.50 - 1.35 (mg/dL)    Calcium 8.8  8.4 - 10.5 (mg/dL)    GFR calc non Af Amer >90  >90 (mL/min)    GFR calc Af Amer >90  >90 (mL/min)   CBC     Status: Abnormal   Collection Time   05/29/11  4:00 AM      Component Value Range Comment   WBC 14.0 (*) 4.0 - 10.5 (K/uL)    RBC 4.79  4.22 - 5.81 (MIL/uL)    Hemoglobin 14.2  13.0 - 17.0 (g/dL)    HCT 14.7  82.9 - 56.2 (%)    MCV 86.6  78.0 - 100.0 (fL)    MCH 29.6  26.0 - 34.0 (pg)    MCHC 34.2  30.0 - 36.0 (g/dL)    RDW 13.0  86.5 - 78.4 (%)    Platelets 174  150 - 400 (K/uL)  Imaging: Imaging results have been reviewed  Assessment/Plan:   1. Active Problems: 2.  Angina at rest 3.  Abnormal nuclear stress test 4.  CAD (coronary artery disease)critical ostial LAD stenosis--undergoing CABG 05/27/11  off pump procedure  LIMA to LAD 5.  Tobacco abuse 6.  Dyslipidemia 7.   Time Spent Directly with Patient:  20 minutes  Length of Stay:  LOS: 4 days   POD #3 CABG X 1 with off pump LIMA to LAD. Looks great!!! NSR. Ambulating. Labs OK. No arrhythmias. Probably ready for D/C but will defer to TCTS. Pt already has F/U appointment with Dr. Cloyde Reams.   Runell Gess 05/30/2011, 8:21 AM

## 2011-05-30 NOTE — Discharge Instructions (Signed)
Coronary Artery Bypass Grafting Care After Refer to this sheet in the next few weeks. These instructions provide you with information on caring for yourself after your procedure. Your caregiver may also give you more specific instructions. Your treatment has been planned according to current medical practices, but problems sometimes occur. Call your caregiver if you have any problems or questions after your procedure.   Recovery from open heart surgery will be different for everyone. Some people feel well after 3 or 4 weeks, while for others it takes longer. After heart surgery, it may be normal to:  Not have an appetite, feel nauseated by the smell of food, or only want to eat a small amount.   Be constipated because of changes in your diet, activity, and medicines. Eat foods high in fiber. Add fresh fruits and vegetables to your diet. Stool softeners may be helpful.   Feel sad or unhappy. You may be frustrated or cranky. You may have good days and bad days. Do not give up. Talk to your caregiver if you do not feel better.   Feel weakness and fatigue. You many need physical therapy or cardiac rehabilitation to get your strength back.   Develop an irregular heartbeat called atrial fibrillation. Symptoms of atrial fibrillation are a fast, irregular heartbeat or feelings of fluttery heartbeats, shortness of breath, low blood pressure, and dizziness. If these symptoms develop, see your caregiver right away.  MEDICATION  Have a list of all the medicines you will be taking when you leave the hospital. For every medicine, know the following:   Name.   Exact dose.   Time of day to be taken.   How often it should be taken.   Why you are taking it.   Ask which medicines should or should not be taken together. If you take more than one heart medicine, ask if it is okay to take them together. Some heart medicines should not be taken at the same time because they may lower your blood pressure too  much.   Narcotic pain medicine can cause constipation. Eat fresh fruits and vegetables. Add fiber to your diet. Stool softener medicine may help relieve constipation.   Keep a copy of your medicines with you at all times.   Do not add or stop taking any medicine until you check with your caregiver.   Medicines can have side effects. Call your caregiver who prescribed the medicine if you:   Start throwing up, have diarrhea, or have stomach pain.   Feel dizzy or lightheaded when you stand up.   Feel your heart is skipping beats or is beating too fast or too slow.   Develop a rash.   Notice unusual bruising or bleeding.  HOME CARE INSTRUCTIONS  After heart surgery, it is important to learn how to take your pulse. Have your caregiver show you how to take your pulse.   Use your incentive spirometer. Ask your caregiver how long after surgery you need to use it.  Care of your chest incision  Tell your caregiver right away if you notice clicking in your chest (sternum).   Support your chest with a pillow or your arms when you take deep breaths and cough.   Follow your caregiver's instructions about when you can bathe or swim.   Protect your incision from sunlight during the first year to keep the scar from getting dark.   Tell your caregiver if you notice:   Increased tenderness of your incision.   Increased   redness or swelling around your incision.   Drainage or pus from your incision.  Care of your leg incision(s)  Avoid crossing your legs.   Avoid sitting for long periods of time. Change positions every half hour.   Elevate your leg(s) when you are sitting.   Check your leg(s) daily for swelling. Check the incisions for redness or drainage.   Wear your elastic stockings as told by your caregiver. Take them off at bedtime.  Diet  Diet is very important to heart health.   Eat plenty of fresh fruits and vegetables. Meats should be lean cut. Avoid canned, processed, and  fried foods.   Talk to a dietician. They can teach you how to make healthy food and drink choices.  Weight  Weigh yourself every day. This is important because it helps to know if you are retaining fluid that may make your heart and lungs work harder.   Use the same scale each time.   Weigh yourself every morning at the same time. You should do this after you go to the bathroom, but before you eat breakfast.   Your weight will be more accurate if you do not wear any clothes.   Record your weight.   Tell your caregiver if you have gained 2 pounds or more overnight.  Activity Stop any activity at once if you have chest pain, shortness of breath, irregular heartbeats, or dizziness. Get help right away if you have any of these symptoms.  Bathing.  Avoid soaking in a bath or hot tub until your incisions are healed.   Rest. You need a balance of rest and activity.   Exercise. Exercise per your caregiver's advice. You may need physical therapy or cardiac rehabilitation to help strengthen your muscles and build your endurance.   Climbing stairs. Unless your caregiver tells you not to climb stairs, go up stairs slowly and rest if you tire. Do not pull yourself up by the handrail.   Driving a car. Follow your caregiver's advice on when you may drive. You may ride as a passenger at any time. When traveling for long periods of time in a car, get out of the car and walk around for a few minutes every 2 hours.   Lifting. Avoid lifting, pushing, or pulling anything heavier than 10 pounds for 6 weeks after surgery or as told by your caregiver.   Returning to work. Check with your caregiver. People heal at different rates. Most people will be able to go back to work 6 to 12 weeks after surgery.   Sexual activity. You may resume sexual relations as told by your caregiver.  SEEK MEDICAL CARE IF:  Any of your incisions are red, painful, or have any type of drainage coming from them.   You have an  oral temperature above 102 F (38.9 C).   You have ankle or leg swelling.   You have pain in your legs.   You have weight gain of 2 or more pounds a day.   You feel dizzy or lightheaded when you stand up.  SEEK IMMEDIATE MEDICAL CARE IF:  You have angina or chest pain that goes to your jaw or arms. Call your local emergency services right away.   You have shortness of breath at rest or with activity.   You have a fast or irregular heartbeat (arrhythmia).   There is a "clicking" in your sternum when you move.   You have numbness or weakness in your arms or legs.    MAKE SURE YOU:  Understand these instructions.   Will watch your condition.   Will get help right away if you are not doing well or get worse.  Document Released: 09/05/2004 Document Revised: 02/05/2011 Document Reviewed: 04/23/2010 [employer] Patient Information 2012 [employer]. 

## 2011-05-30 NOTE — Progress Notes (Signed)
Patient has been walking independently.  Reinforced some areas of education.  He had questions about e-cigarettes  Handout given to patient regarding FDA warning.  Ready for discharge.  10:45 am to 11:00 AM  Cathie Olden RN

## 2011-05-30 NOTE — Progress Notes (Signed)
[address]            [place] [address]          [phone]     3 Days Post-Op  Procedure(s) (LRB): OFF PUMP CORONARY ARTERY BYPASS GRAFTING (CABG) (N/A) Subjective: Remains anxious to go home. No new complaints  Objective  Telemetry SR  Temp:  [98.1 F (36.7 C)-98.4 F (36.9 C)] 98.4 F (36.9 C) (03/30 0511) Pulse Rate:  [68-72] 68  (03/30 0511) Resp:  [16] 16  (03/30 0511) BP: (114-134)/(71-79) 134/79 mmHg (03/30 0511) SpO2:  [93 %-94 %] 93 % (03/30 0511) Weight:  [215 lb 14.4 oz (97.932 kg)] 215 lb 14.4 oz (97.932 kg) (03/30 0511)   Intake/Output Summary (Last 24 hours) at 05/30/11 1423 Last data filed at 05/29/11 1700  Gross per 24 hour  Intake    240 ml  Output    600 ml  Net   -360 ml       General appearance: alert, cooperative and no distress Heart: regular rate and rhythm and S1, S2 normal Lungs: clear to auscultation bilaterally Abdomen: soft, non-tender; bowel sounds normal; no masses,  no organomegaly Extremities: trace edema Wound: incision healing well  Lab Results:  Basename 05/29/11 0400 05/28/11 0400 05/27/11 1825  NA 138 137 --  K 4.0 4.1 --  CL 103 105 --  CO2 26 24 --  GLUCOSE 99 108* --  BUN 18 13 --  CREATININE 0.84 0.72 --  CALCIUM 8.8 8.4 --  MG -- 2.2 2.4  PHOS -- -- --   No results found for this basename: AST:2,ALT:2,ALKPHOS:2,BILITOT:2,PROT:2,ALBUMIN:2 in the last 72 hours No results found for this basename: LIPASE:2,AMYLASE:2 in the last 72 hours  Basename 05/29/11 0400 05/28/11 0400  WBC 14.0* 12.0*  NEUTROABS -- --  HGB 14.2 13.3  HCT 41.5 38.6*  MCV 86.6 85.8  PLT 174 167   No results found for this basename: CKTOTAL:4,CKMB:4,TROPONINI:4 in the last 72 hours No components found with this basename: POCBNP:3 No results found for this basename: DDIMER in the last 72 hours No results found for this basename: HGBA1C in the last 72 hours No results found for this basename:  CHOL,HDL,LDLCALC,TRIG,CHOLHDL in the last 72 hours No results found for this basename: TSH,T4TOTAL,FREET3,T3FREE,THYROIDAB in the last 72 hours No results found for this basename: VITAMINB12,FOLATE,FERRITIN,TIBC,IRON,RETICCTPCT in the last 72 hours  Medications: Scheduled    . acetaminophen  1,000 mg Oral Q6H   Or  . acetaminophen (TYLENOL) oral liquid 160 mg/5 mL  975 mg Per Tube Q6H  . aspirin EC  325 mg Oral Daily   Or  . aspirin  324 mg Per Tube Daily  . bisacodyl  10 mg Oral Daily   Or  . bisacodyl  10 mg Rectal Daily  . docusate sodium  200 mg Oral Daily  . enoxaparin  40 mg Subcutaneous QHS  . furosemide  40 mg Oral Daily  . metoprolol succinate  50 mg Oral Daily  . omega-3 acid ethyl esters  1 g Oral BID  . pantoprazole  40 mg Oral Q1200  . potassium chloride  20 mEq Oral BID  . ramipril  5 mg Oral Daily  . simvastatin  40 mg Oral QPM  . sodium chloride  3 mL Intravenous Q12H     Radiology/Studies:  Dg Chest 2 View  05/29/2011  *RADIOLOGY REPORT*  Clinical Data:  Post CABG procedure.  CHEST - 2 VIEW  Comparison: 05/28/2011  Findings: The chest drains have been removed.  There are patchy linear densities in both lungs that are most suggestive for atelectasis.  Swan-Ganz catheter has been removed.  No evidence for a pneumothorax.  Heart size is stable.  Slightly improved aeration at the left lung base. Lateral view suggests small pleural effusions.  Epicardial pacer wires are present.  IMPRESSION: Removal of chest drains without a pneumothorax.  Bilateral atelectasis. Small pleural effusions.  Original Report Authenticated By: Richarda Overlie, M.D.    INR: Will add last result for INR, ABG once components are confirmed Will add last 4 CBG results once components are confirmed  Assessment/Plan: S/P Procedure(s) (LRB): OFF PUMP CORONARY ARTERY BYPASS GRAFTING (CABG) (N/A)  1. Appears stable for d/c, needs to continue pulm toilet, ambulation.   LOS: 4 days    Aura Bibby  E 3/30/20132:23 PM

## 2011-05-30 NOTE — Progress Notes (Signed)
Removed pt's chest tube sutures. Applied steri strips and benzoin to site. No bleeding. Checked pt's INR (WNL) and heart rhythm over past few days. Pt has been in SR. Removed pacing wires. No bleeding. Pt tolerated very well. Pt on bed rest for one hour with frequent vitals going. Will continue to monitor.

## 2011-06-01 ENCOUNTER — Other Ambulatory Visit: Payer: Self-pay | Admitting: Thoracic Surgery (Cardiothoracic Vascular Surgery)

## 2011-06-01 DIAGNOSIS — I2510 Atherosclerotic heart disease of native coronary artery without angina pectoris: Secondary | ICD-10-CM

## 2011-06-01 MED FILL — Sodium Chloride IV Soln 0.9%: INTRAVENOUS | Qty: 1000 | Status: AC

## 2011-06-01 MED FILL — Heparin Sodium (Porcine) Inj 1000 Unit/ML: INTRAMUSCULAR | Qty: 1 | Status: AC

## 2011-06-18 ENCOUNTER — Other Ambulatory Visit: Payer: Self-pay | Admitting: Thoracic Surgery (Cardiothoracic Vascular Surgery)

## 2011-06-18 DIAGNOSIS — I2510 Atherosclerotic heart disease of native coronary artery without angina pectoris: Secondary | ICD-10-CM

## 2011-06-24 ENCOUNTER — Ambulatory Visit (INDEPENDENT_AMBULATORY_CARE_PROVIDER_SITE_OTHER): Payer: Self-pay | Admitting: Thoracic Surgery (Cardiothoracic Vascular Surgery)

## 2011-06-24 ENCOUNTER — Encounter: Payer: Self-pay | Admitting: Thoracic Surgery (Cardiothoracic Vascular Surgery)

## 2011-06-24 ENCOUNTER — Ambulatory Visit
Admission: RE | Admit: 2011-06-24 | Discharge: 2011-06-24 | Disposition: A | Discharge status: Home / Self Care | Payer: Medicaid Other | Source: Ambulatory Visit | Attending: Thoracic Surgery (Cardiothoracic Vascular Surgery) | Admitting: Thoracic Surgery (Cardiothoracic Vascular Surgery)

## 2011-06-24 VITALS — BP 106/61 | HR 55 | Resp 18 | Ht 69.0 in | Wt 209.0 lb

## 2011-06-24 DIAGNOSIS — I2510 Atherosclerotic heart disease of native coronary artery without angina pectoris: Secondary | ICD-10-CM

## 2011-06-24 DIAGNOSIS — Z951 Presence of aortocoronary bypass graft: Secondary | ICD-10-CM

## 2011-06-24 NOTE — Progress Notes (Signed)
  HPI:  [name] returns today for a scheduled followup visit. He had coronary artery bypass grafting times one with a LIMA to his LAD off pump on March 26. He did well postoperatively and has continued to do well since his discharge from the hospital. He is taking oxycodone at times primarily for back pain rather than sternotomy pain. He says that he had some drainage in the upper part of the sternal wound about a week ago. He saw Dr. Alanda Wang was treated with local wound care and oral antibiotics. He said the drainage stopped after about 3 days. He has not felt any clicking or popping of the sternum. He has not had any peripheral edema.  Past Medical History  Diagnosis Date  . Chest pain   . Coronary artery disease   . Hypertension   . Arthritis   . Smoker   . Angina at rest 05/28/2011  . Abnormal nuclear stress test 05/28/2011  . Tobacco abuse 05/28/2011  . Dyslipidemia 05/28/2011     Current Outpatient Prescriptions  Medication Sig Dispense Refill  . aspirin EC 325 MG EC tablet Take 1 tablet (325 mg total) by mouth daily.  30 tablet    . metoprolol succinate (TOPROL-XL) 50 MG 24 hr tablet Take 50 mg by mouth daily. Take with or immediately following a meal.      . Omega-3 Fatty Acids (FISH OIL PO) Take 1 capsule by mouth 3 (three) times daily.      Marland Kitchen oxycodone (OXY-IR) 5 MG capsule Take 5 mg by mouth every 4 (four) hours as needed. Post op RX for pain, S/P CABG      . oxyCODONE-acetaminophen (PERCOCET) 10-325 MG per tablet Take 1 tablet by mouth every 4 (four) hours as needed. For chronic back pain, Dr [name]      . pantoprazole (PROTONIX) 40 MG tablet Take 40 mg by mouth daily.      . ramipril (ALTACE) 5 MG capsule Take 5 mg by mouth daily.      . simvastatin (ZOCOR) 40 MG tablet Take 40 mg by mouth every evening.        Physical Exam BP 106/61  Pulse 55  Resp 18  Ht 5\' 9"  (1.753 m)  Wt 209 lb (94.802 kg)  BMI 30.86 kg/m2  SpO2 97% General well-developed well-nourished  and in no acute distress Lungs clear, equal bilaterally Cardiac regular rate and rhythm no rubs or murmurs Sternum stable, incision clean dry and intact No peripheral edema  Diagnostic Tests: Chest x-ray shows a very small left pleural effusion  Impression: 61 year old gentleman now about a month out from off pump coronary bypass grafting x1. He is doing very well at this point in time and is anxious to increase his activities. He may begin driving with the provision that he should not drive was taking oxycodone for his back pain. Appropriate precautions with driving were discussed. He is not to exceed 30-40 miles per hour for 20-30 minutes in length for the first 3-4 weeks. After that he can increase as tolerated. He is not to lift over 10 pounds for another 2 weeks or 20 pounds for another 4 weeks, beyond that his activities are unrestricted. He will start cardiac rehabilitation next week.   Plan:  He'll continue to be followed by Dr. Susa Wang.  I will be happy to see him back at any time in the future that I can be of any further assistance with his care.

## 2012-05-30 ENCOUNTER — Other Ambulatory Visit: Payer: Self-pay | Admitting: Neurosurgery

## 2012-05-30 DIAGNOSIS — M549 Dorsalgia, unspecified: Secondary | ICD-10-CM

## 2012-05-30 DIAGNOSIS — M5410 Radiculopathy, site unspecified: Secondary | ICD-10-CM

## 2012-06-03 ENCOUNTER — Ambulatory Visit
Admission: RE | Admit: 2012-06-03 | Discharge: 2012-06-03 | Disposition: A | Discharge status: Home / Self Care | Payer: Medicaid Other | Source: Ambulatory Visit | Attending: Neurosurgery | Admitting: Neurosurgery

## 2012-06-03 VITALS — BP 165/83 | HR 84

## 2012-06-03 DIAGNOSIS — M5410 Radiculopathy, site unspecified: Secondary | ICD-10-CM

## 2012-06-03 DIAGNOSIS — M549 Dorsalgia, unspecified: Secondary | ICD-10-CM

## 2012-06-03 MED ORDER — METHYLPREDNISOLONE ACETATE 40 MG/ML INJ SUSP (RADIOLOG
120.0000 mg | Freq: Once | INTRAMUSCULAR | Status: AC
  Administered 2012-06-03: 120 mg via EPIDURAL

## 2012-06-03 MED ORDER — IOHEXOL 180 MG/ML  SOLN
1.0000 mL | Freq: Once | INTRAMUSCULAR | Status: AC | PRN
  Administered 2012-06-03: 1 mL via EPIDURAL

## 2012-08-02 ENCOUNTER — Encounter: Payer: Self-pay | Admitting: Cardiovascular Disease

## 2012-08-10 ENCOUNTER — Other Ambulatory Visit (HOSPITAL_COMMUNITY): Payer: Self-pay | Admitting: Cardiovascular Disease

## 2012-08-10 ENCOUNTER — Ambulatory Visit (HOSPITAL_COMMUNITY): Payer: Medicaid Other

## 2012-08-10 DIAGNOSIS — R011 Cardiac murmur, unspecified: Secondary | ICD-10-CM

## 2012-10-07 ENCOUNTER — Other Ambulatory Visit: Payer: Self-pay | Admitting: Cardiovascular Disease

## 2012-10-07 LAB — COMPREHENSIVE METABOLIC PANEL
ALT: 20 U/L (ref 0–53)
AST: 21 U/L (ref 0–37)
Albumin: 4.1 g/dL (ref 3.5–5.2)
Alkaline Phosphatase: 75 U/L (ref 39–117)
BUN: 14 mg/dL (ref 6–23)
CO2: 29 mEq/L (ref 19–32)
Calcium: 9.4 mg/dL (ref 8.4–10.5)
Chloride: 106 mEq/L (ref 96–112)
Creat: 0.86 mg/dL (ref 0.50–1.35)
Glucose, Bld: 83 mg/dL (ref 70–99)
Potassium: 4.7 mEq/L (ref 3.5–5.3)
Sodium: 140 mEq/L (ref 135–145)
Total Bilirubin: 0.7 mg/dL (ref 0.3–1.2)
Total Protein: 6.6 g/dL (ref 6.0–8.3)

## 2012-10-07 LAB — LIPID PANEL
Cholesterol: 141 mg/dL (ref 0–200)
HDL: 39 mg/dL — ABNORMAL LOW (ref >39)
LDL Cholesterol: 85 mg/dL (ref 0–99)
Total CHOL/HDL Ratio: 3.6 Ratio
Triglycerides: 86 mg/dL (ref <150)
VLDL: 17 mg/dL (ref 0–40)

## 2013-02-02 ENCOUNTER — Other Ambulatory Visit: Payer: Self-pay | Admitting: *Deleted

## 2013-02-02 MED ORDER — RAMIPRIL 5 MG PO CAPS: 5.0000 mg | ORAL_CAPSULE | Freq: Every day | ORAL | Status: DC

## 2013-02-02 MED ORDER — PANTOPRAZOLE SODIUM 40 MG PO TBEC: 40.0000 mg | DELAYED_RELEASE_TABLET | Freq: Every day | ORAL | Status: DC

## 2013-02-02 NOTE — Telephone Encounter (Signed)
Rx was sent to pharmacy electronically. 

## 2013-05-24 ENCOUNTER — Ambulatory Visit (INDEPENDENT_AMBULATORY_CARE_PROVIDER_SITE_OTHER): Payer: Medicare HMO | Admitting: Cardiovascular Disease

## 2013-05-24 ENCOUNTER — Encounter: Payer: Self-pay | Admitting: Cardiovascular Disease

## 2013-05-24 VITALS — BP 130/80 | HR 53 | Ht 64.0 in | Wt 229.0 lb

## 2013-05-24 DIAGNOSIS — E785 Hyperlipidemia, unspecified: Secondary | ICD-10-CM

## 2013-05-24 DIAGNOSIS — I10 Essential (primary) hypertension: Secondary | ICD-10-CM | POA: Insufficient documentation

## 2013-05-24 DIAGNOSIS — I2510 Atherosclerotic heart disease of native coronary artery without angina pectoris: Secondary | ICD-10-CM

## 2013-05-24 NOTE — Assessment & Plan Note (Signed)
Controlled on current medications 

## 2013-05-24 NOTE — Patient Instructions (Signed)
We request that you follow-up  in 1 year with Dr San MorelleBerry  You will receive a reminder letter in the mail two months in advance. If you don't receive a letter, please call our office to schedule the follow-up appointment.  Your physician recommends that you return for lab work in: August,  I will mail you a lab slip

## 2013-05-24 NOTE — Progress Notes (Signed)
05/24/2013 [name]   1950-06-11  [id number]  Primary Physician Kirk Ruths, MD Primary Cardiologist: Runell Gess MD Roseanne Reno   HPI:  [name] is a 63 year old moderately overweight married Caucasian male with no children who has been out of work since 2010 because of back issues. He was a patient of Dr. Kandis Cocking. I am assuming his care. He has a history of CAD status post off-pump LIMA performed by Dr. Andrey Spearman precisely 7/13 after a cardiac catheterization performed by Dr. Rennis Golden revealed a high-grade ostial LAD stenosis. His other problems include discontinue tobacco abuse at that time I spoke 35 pack years, treated hypertension and hyperlipidemia. There is no family history of heart disease. He has never had a heart attack or stroke. He denies chest pain or shortness of breath   Current Outpatient Prescriptions  Medication Sig Dispense Refill  . aspirin 81 MG tablet Take 81 mg by mouth daily.      Marland Kitchen atorvastatin (LIPITOR) 40 MG tablet Take 40 mg by mouth daily.      Marland Kitchen ibuprofen (ADVIL,MOTRIN) 800 MG tablet Take 800 mg by mouth daily.      . metoprolol succinate (TOPROL-XL) 50 MG 24 hr tablet Take 25 mg by mouth daily. Take with or immediately following a meal.      . Omega-3 Fatty Acids (FISH OIL PO) Take 1 capsule by mouth 3 (three) times daily.      Marland Kitchen oxycodone (OXY-IR) 5 MG capsule Take 5 mg by mouth every 4 (four) hours as needed. Post op RX for pain, S/P CABG      . pantoprazole (PROTONIX) 40 MG tablet Take 1 tablet (40 mg total) by mouth daily.  30 tablet  8  . ramipril (ALTACE) 5 MG capsule Take 1 capsule (5 mg total) by mouth daily.  30 capsule  8   No current facility-administered medications for this visit.    Allergies  Allergen Reactions  . Penicillins Other (See Comments)    unknown    History   Social History  . Marital Status: Married    Spouse Name: N/A    Number of Children: N/A  . Years of Education:  N/A   Occupational History  . Not on file.   Social History Main Topics  . Smoking status: Former Smoker -- 1.00 packs/day for 35 years    Types: Cigarettes    Quit date: 05/25/2011  . Smokeless tobacco: Never Used  . Alcohol Use: No  . Drug Use: No  . Sexual Activity: Not Currently   Other Topics Concern  . Not on file   Social History Narrative  . No narrative on file     Review of Systems: General: negative for chills, fever, night sweats or weight changes.  Cardiovascular: negative for chest pain, dyspnea on exertion, edema, orthopnea, palpitations, paroxysmal nocturnal dyspnea or shortness of breath Dermatological: negative for rash Respiratory: negative for cough or wheezing Urologic: negative for hematuria Abdominal: negative for nausea, vomiting, diarrhea, bright red blood per rectum, melena, or hematemesis Neurologic: negative for visual changes, syncope, or dizziness All other systems reviewed and are otherwise negative except as noted above.    Blood pressure 130/80, pulse 53, height 5\' 4"  (1.626 m), weight 229 lb (103.874 kg).  General appearance: alert and no distress Neck: no adenopathy, no carotid bruit, no JVD, supple, symmetrical, trachea midline and thyroid not enlarged, symmetric, no tenderness/mass/nodules Lungs: clear to auscultation bilaterally Heart: regular rate and rhythm, S1, S2  normal, no murmur, click, rub or gallop Abdomen: soft, non-tender; bowel sounds normal; no masses,  no organomegaly Extremities: extremities normal, atraumatic, no cyanosis or edema and plus pedal pulses bilaterally  EKG sinus bradycardia at 53 without ST or T wave changes   ASSESSMENT AND PLAN:   CAD (coronary artery disease)critical ostial LAD stenosis--undergoing CABG 05/27/11  off pump procedure  LIMA to LAD History of off-pump LIMA to the LAD performed by Dr. Andrey SpearmanSteve Hendrickson 05/27/11 after a cardiac catheterization performed by Dr. Rennis GoldenHilty revealed high-grade ostial  LAD disease. This was done after an abnormal Myoview stress test.  He's had no further symptoms.  Dyslipidemia On statin therapy with his most recent lipid profile performed 10/07/12 revealing a total cholesterol of 141, LDL 85 and HDL of 39  Essential hypertension Controlled on current medications      Runell GessJonathan J. Elena Cothern MD [facility] And Healthcare ServicesFACP,FACC,FAHA, [facility] CenterFSCAI 05/24/2013 11:28 AM

## 2013-05-24 NOTE — Assessment & Plan Note (Addendum)
On statin therapy with his most recent lipid profile performed 10/07/12 revealing a total cholesterol of 141, LDL 85 and HDL of 39

## 2013-05-24 NOTE — Assessment & Plan Note (Signed)
History of off-pump LIMA to the LAD performed by Dr. Andrey SpearmanSteve Hendrickson 05/27/11 after a cardiac catheterization performed by Dr. Rennis GoldenHilty revealed high-grade ostial LAD disease. This was done after an abnormal Myoview stress test.  He's had no further symptoms.

## 2013-08-07 ENCOUNTER — Other Ambulatory Visit: Payer: Self-pay | Admitting: *Deleted

## 2013-08-07 MED ORDER — METOPROLOL SUCCINATE ER 50 MG PO TB24: ORAL_TABLET | ORAL | Status: DC

## 2013-08-07 MED ORDER — ATORVASTATIN CALCIUM 40 MG PO TABS: 40.0000 mg | ORAL_TABLET | Freq: Every day | ORAL | Status: DC

## 2013-08-07 NOTE — Telephone Encounter (Signed)
Rx was sent to pharmacy electronically. 

## 2013-08-31 ENCOUNTER — Other Ambulatory Visit: Payer: Self-pay | Admitting: *Deleted

## 2013-08-31 DIAGNOSIS — E782 Mixed hyperlipidemia: Secondary | ICD-10-CM

## 2013-08-31 DIAGNOSIS — Z79899 Other long term (current) drug therapy: Secondary | ICD-10-CM

## 2013-10-16 ENCOUNTER — Telehealth: Payer: Self-pay | Admitting: Cardiovascular Disease

## 2013-10-16 MED ORDER — VALSARTAN 80 MG PO TABS
80.0000 mg | ORAL_TABLET | Freq: Every day | ORAL | Status: DC
Start: 2013-10-16 — End: 2014-03-26

## 2013-10-16 NOTE — Telephone Encounter (Signed)
The pt called in stating that he thinks he is having a reaction to one of the meds that he is taking . He said that he is having a really bad dry cough for a few days and as a result of that he has stopped taking his all his meds which are pantoprozole, ramipril, and metoprolol. He would like to know what to do.Please call  Thanks

## 2013-10-16 NOTE — Telephone Encounter (Signed)
Discussed with laura ingold np, pt to stop the ramipril and start diovan 80 mg once daily. Pt made aware it can take up to 4 weeks for the cough to go away if it is related to the ramipril. Patient voiced understanding

## 2013-10-16 NOTE — Telephone Encounter (Signed)
Spoke with pt, he reports an occasional dry hacking cough. He has not taken his medicine this am. He reports his PCP thinks it maybe one of his cardiac meds. Explained to pt the pantoprazole and metoprolol do not usually carry that side effect. Will talk to someone here about changing his ramipril. Patient voiced understanding to restart the metoprolol and pantoprazole.

## 2013-10-31 ENCOUNTER — Other Ambulatory Visit: Payer: Self-pay

## 2013-10-31 MED ORDER — PANTOPRAZOLE SODIUM 40 MG PO TBEC: 40.0000 mg | DELAYED_RELEASE_TABLET | Freq: Every day | ORAL | Status: DC

## 2013-10-31 NOTE — Telephone Encounter (Signed)
Rx was sent to pharmacy electronically. 

## 2013-11-04 LAB — HEPATIC FUNCTION PANEL
ALT: 34 U/L (ref 0–53)
AST: 24 U/L (ref 0–37)
Albumin: 4.5 g/dL (ref 3.5–5.2)
Alkaline Phosphatase: 76 U/L (ref 39–117)
Bilirubin, Direct: 0.2 mg/dL (ref 0.0–0.3)
Indirect Bilirubin: 0.7 mg/dL (ref 0.2–1.2)
Total Bilirubin: 0.9 mg/dL (ref 0.2–1.2)
Total Protein: 6.9 g/dL (ref 6.0–8.3)

## 2013-11-04 LAB — LIPID PANEL
Cholesterol: 136 mg/dL (ref 0–200)
HDL: 47 mg/dL (ref >39)
LDL Cholesterol: 74 mg/dL (ref 0–99)
Total CHOL/HDL Ratio: 2.9 Ratio
Triglycerides: 74 mg/dL (ref <150)
VLDL: 15 mg/dL (ref 0–40)

## 2013-11-07 ENCOUNTER — Encounter: Payer: Self-pay | Admitting: *Deleted

## 2013-12-26 ENCOUNTER — Telehealth: Payer: Self-pay | Admitting: Cardiovascular Disease

## 2013-12-26 MED ORDER — PANTOPRAZOLE SODIUM 40 MG PO TBEC: 40.0000 mg | DELAYED_RELEASE_TABLET | Freq: Every day | ORAL | Status: DC

## 2013-12-26 MED ORDER — METOPROLOL SUCCINATE ER 50 MG PO TB24: ORAL_TABLET | ORAL | Status: DC

## 2013-12-26 NOTE — Telephone Encounter (Signed)
Pt called in stating that he would now like to receive his meds through [facility] mail order pharmacy. He would his Pantoprazole and Metoprolol sent there.  Fax: [phone] Telephone: [phone]  Thanks

## 2013-12-26 NOTE — Telephone Encounter (Signed)
Rx was sent to pharmacy electronically. 

## 2014-02-08 ENCOUNTER — Encounter (HOSPITAL_COMMUNITY): Payer: Self-pay | Admitting: Internal Medicine

## 2014-02-19 ENCOUNTER — Telehealth: Payer: Self-pay | Admitting: Cardiovascular Disease

## 2014-02-20 NOTE — Telephone Encounter (Signed)
Close encounter 

## 2014-03-02 ENCOUNTER — Emergency Department (HOSPITAL_COMMUNITY): Payer: Medicare HMO

## 2014-03-02 ENCOUNTER — Emergency Department (HOSPITAL_COMMUNITY)
Admission: EM | Admit: 2014-03-02 | Discharge: 2014-03-02 | Disposition: A | Discharge status: Home / Self Care | Payer: Medicare HMO | Attending: Emergency Medicine | Admitting: Emergency Medicine

## 2014-03-02 ENCOUNTER — Encounter (HOSPITAL_COMMUNITY): Payer: Self-pay | Admitting: *Deleted

## 2014-03-02 DIAGNOSIS — I10 Essential (primary) hypertension: Secondary | ICD-10-CM | POA: Insufficient documentation

## 2014-03-02 DIAGNOSIS — Z9889 Other specified postprocedural states: Secondary | ICD-10-CM | POA: Diagnosis not present

## 2014-03-02 DIAGNOSIS — I2510 Atherosclerotic heart disease of native coronary artery without angina pectoris: Secondary | ICD-10-CM | POA: Insufficient documentation

## 2014-03-02 DIAGNOSIS — M1990 Unspecified osteoarthritis, unspecified site: Secondary | ICD-10-CM | POA: Diagnosis not present

## 2014-03-02 DIAGNOSIS — Y998 Other external cause status: Secondary | ICD-10-CM | POA: Insufficient documentation

## 2014-03-02 DIAGNOSIS — Z880 Allergy status to penicillin: Secondary | ICD-10-CM | POA: Insufficient documentation

## 2014-03-02 DIAGNOSIS — Z7982 Long term (current) use of aspirin: Secondary | ICD-10-CM | POA: Diagnosis not present

## 2014-03-02 DIAGNOSIS — T1490XA Injury, unspecified, initial encounter: Secondary | ICD-10-CM

## 2014-03-02 DIAGNOSIS — E785 Hyperlipidemia, unspecified: Secondary | ICD-10-CM | POA: Insufficient documentation

## 2014-03-02 DIAGNOSIS — Z87891 Personal history of nicotine dependence: Secondary | ICD-10-CM | POA: Diagnosis not present

## 2014-03-02 DIAGNOSIS — S40911A Unspecified superficial injury of right shoulder, initial encounter: Secondary | ICD-10-CM | POA: Diagnosis present

## 2014-03-02 DIAGNOSIS — S4991XA Unspecified injury of right shoulder and upper arm, initial encounter: Secondary | ICD-10-CM

## 2014-03-02 DIAGNOSIS — X58XXXA Exposure to other specified factors, initial encounter: Secondary | ICD-10-CM | POA: Diagnosis not present

## 2014-03-02 DIAGNOSIS — Y9389 Activity, other specified: Secondary | ICD-10-CM | POA: Insufficient documentation

## 2014-03-02 DIAGNOSIS — Y929 Unspecified place or not applicable: Secondary | ICD-10-CM | POA: Insufficient documentation

## 2014-03-02 DIAGNOSIS — Z79899 Other long term (current) drug therapy: Secondary | ICD-10-CM | POA: Diagnosis not present

## 2014-03-02 MED ORDER — KETOROLAC TROMETHAMINE 60 MG/2ML IM SOLN
60.0000 mg | Freq: Once | INTRAMUSCULAR | Status: AC
  Administered 2014-03-02: 60 mg via INTRAMUSCULAR
  Filled 2014-03-02: qty 2

## 2014-03-02 NOTE — ED Notes (Signed)
Pain rt shoulder after using a shovel to dig.  Good radial pulse and distal sensation

## 2014-03-02 NOTE — ED Provider Notes (Signed)
CSN: [id number]     Arrival date & time 03/02/14  1518 History   First MD Initiated Contact with Patient 03/02/14 1603     Chief Complaint  Patient presents with  . Shoulder Pain     (Consider location/radiation/quality/duration/timing/severity/associated sxs/prior Treatment) HPI  [name] is a 64 y.o. male who presents to the Emergency Department complaining of right shoulder pain that began suddenly today after using a shovel.  He reports pain along the top of the shoulder and into his upper arm.  Describes the pain as aching and worse with arm movement.  He reports previous injury to the shoulder that he believes was related to " injured ligaments".  He denies fall, neck pain, numbness of the arm, chest pain or swelling.  He has not taken any medication for the pain.      Past Medical History  Diagnosis Date  . Chest pain   . Coronary artery disease   . Hypertension   . Arthritis   . Smoker   . Angina at rest 05/28/2011  . Abnormal nuclear stress test 05/28/2011  . Tobacco abuse 05/28/2011  . Dyslipidemia 05/28/2011   Past Surgical History  Procedure Laterality Date  . Knee arthroscopy w/ debridement    . Cervical disc surgery    . Coronary artery bypass graft  05/27/2011    Procedure: OFF PUMP CORONARY ARTERY BYPASS GRAFTING (CABG);  Surgeon: Loreli Slot, MD;  Location: [facility] OR;  Service: Open Heart Surgery;  Laterality: N/A;  . Left heart catheterization with coronary angiogram N/A 05/26/2011    Procedure: LEFT HEART CATHETERIZATION WITH CORONARY ANGIOGRAM;  Surgeon: Chrystie Nose, MD;  Location: [facility] CATH LAB;  Service: Cardiovascular;  Laterality: N/A;   History reviewed. No pertinent family history. History  Substance Use Topics  . Smoking status: Former Smoker -- 1.00 packs/day for 35 years    Types: Cigarettes    Quit date: 05/25/2011  . Smokeless tobacco: Never Used  . Alcohol Use: No    Review of Systems  Constitutional: Negative for chills.   Respiratory: Negative for shortness of breath.   Cardiovascular: Negative for chest pain.  Genitourinary: Negative for dysuria and difficulty urinating.  Musculoskeletal: Positive for arthralgias (right shoulder pain). Negative for joint swelling.  Skin: Negative for color change and wound.  Neurological: Negative for dizziness, weakness, light-headedness, numbness and headaches.  All other systems reviewed and are negative.     Allergies  Penicillins  Home Medications   Prior to Admission medications   Medication Sig Start Date End Date Taking? Authorizing Provider  aspirin 81 MG tablet Take 81 mg by mouth daily.    Historical Provider, MD  atorvastatin (LIPITOR) 40 MG tablet Take 1 tablet (40 mg total) by mouth daily. 08/07/13   Runell Gess, MD  ibuprofen (ADVIL,MOTRIN) 800 MG tablet Take 800 mg by mouth daily. 04/29/13   Historical Provider, MD  metoprolol succinate (TOPROL-XL) 50 MG 24 hr tablet Take 1/2 tablet ( ) by mouth daily. Take with or immediately following a meal. 12/26/13   Runell Gess, MD  Omega-3 Fatty Acids (FISH OIL PO) Take 1 capsule by mouth 3 (three) times daily.    Historical Provider, MD  oxycodone (OXY-IR) 5 MG capsule Take 5 mg by mouth every 4 (four) hours as needed. Post op RX for pain, S/P CABG    Historical Provider, MD  pantoprazole (PROTONIX) 40 MG tablet Take 1 tablet (40 mg total) by mouth daily. 12/26/13   Delton See  Allyson Sabal, MD  valsartan (DIOVAN) 80 MG tablet Take 1 tablet (80 mg total) by mouth daily. 10/16/13   Leone Brand, NP   BP 124/68 mmHg  Pulse 52  Temp(Src) 97.9 F (36.6 C) (Oral)  Resp 20  Ht  (1.626 m)  Wt 215 lb (97.523 kg)  BMI 36.89 kg/m2  SpO2 97% Physical Exam  Constitutional: He is oriented to person, place, and time. He appears well-developed and well-nourished. No distress.  HENT:  Head: Normocephalic and atraumatic.  Neck: Normal range of motion, full passive range of motion without pain and phonation  normal. Neck supple. No spinous process tenderness and no muscular tenderness present. Normal range of motion present. No thyromegaly present.  Cardiovascular: Normal rate, regular rhythm, normal heart sounds and intact distal pulses.   No murmur heard. Pulmonary/Chest: Effort normal and breath sounds normal. No respiratory distress. He exhibits no tenderness.  Musculoskeletal: He exhibits tenderness. He exhibits no edema.  ttp of the anterior right shoulder.  Pain with abduction of the right arm and rotation of the shoulder.  Radial pulse is brisk, distal sensation intact, CR< 2 sec. Grip strength is strong and symmetrical.   No abrasions, edema , erythema or step-off deformity of the joint.   Lymphadenopathy:    He has no cervical adenopathy.  Neurological: He is alert and oriented to person, place, and time. He has normal strength. No sensory deficit. He exhibits normal muscle tone. Coordination normal.  Skin: Skin is warm and dry.  Nursing note and vitals reviewed.   ED Course  Procedures (including critical care time) Labs Review Labs Reviewed - No data to display  Imaging Review Dg Shoulder Right  03/02/2014   CLINICAL DATA:  Twisting injury with pain.  EXAM: RIGHT SHOULDER - 2+ VIEW  COMPARISON:  None.  FINDINGS: Mild degenerative irregularity of the acromioclavicular joint. No acute fracture or dislocation. Median sternotomy.  IMPRESSION: No acute osseous abnormality.   Electronically Signed   By: Jeronimo Greaves M.D.   On: 03/02/2014 16:21     EKG Interpretation None      MDM   Final diagnoses:  Shoulder injury, right, initial encounter   Patient takes 20 mg oxycodone as needed.  Took one at 2:00 pm.  He agrees to continue as directed and also has  ibuprofen as well.  Referral given for Dr. Hilda Lias.  XR negative for bony injury, but concerning for possible rotator cuff vs labral injury.  Pt agrees to symptomatic tx with his current medications and close ortho f/u if not  improving.    Sling given for comfort and pt advised not for continuous use and perform small ROM exercises.    [name], PA-C 03/04/14 0981  [name], MD 03/05/14 1128

## 2014-03-15 ENCOUNTER — Encounter (HOSPITAL_COMMUNITY): Payer: Self-pay | Admitting: Internal Medicine

## 2014-03-26 ENCOUNTER — Other Ambulatory Visit: Payer: Self-pay | Admitting: Cardiovascular Disease

## 2014-03-26 MED ORDER — PANTOPRAZOLE SODIUM 40 MG PO TBEC
40.0000 mg | DELAYED_RELEASE_TABLET | Freq: Every day | ORAL | Status: DC
Start: 2014-03-26 — End: 2014-09-04

## 2014-03-26 MED ORDER — ATORVASTATIN CALCIUM 40 MG PO TABS: 40.0000 mg | ORAL_TABLET | Freq: Every day | ORAL | Status: DC

## 2014-03-26 MED ORDER — METOPROLOL SUCCINATE ER 50 MG PO TB24: ORAL_TABLET | ORAL | Status: DC

## 2014-03-26 MED ORDER — VALSARTAN 80 MG PO TABS
80.0000 mg | ORAL_TABLET | Freq: Every day | ORAL | Status: DC
Start: 2014-03-26 — End: 2014-09-04

## 2014-03-26 NOTE — Telephone Encounter (Signed)
Per Answering Service-Pt need four prescriptions refilled. Pt did not name the medicine.

## 2014-03-26 NOTE — Telephone Encounter (Signed)
Returned call to patient he stated he needed refills on medication Dr.Berry has prescribed.Stated his wife recently died and he will be seeing [place].Branch in [place].Refills sent to pharmacy.

## 2014-04-17 ENCOUNTER — Emergency Department (HOSPITAL_COMMUNITY): Payer: Medicare HMO

## 2014-04-17 ENCOUNTER — Emergency Department (HOSPITAL_COMMUNITY)
Admission: EM | Admit: 2014-04-17 | Discharge: 2014-04-17 | Disposition: A | Discharge status: Home / Self Care | Payer: Medicare HMO | Attending: Emergency Medicine | Admitting: Emergency Medicine

## 2014-04-17 ENCOUNTER — Encounter (HOSPITAL_COMMUNITY): Payer: Self-pay | Admitting: Emergency Medicine

## 2014-04-17 DIAGNOSIS — M79605 Pain in left leg: Secondary | ICD-10-CM | POA: Diagnosis not present

## 2014-04-17 DIAGNOSIS — M1990 Unspecified osteoarthritis, unspecified site: Secondary | ICD-10-CM | POA: Diagnosis not present

## 2014-04-17 DIAGNOSIS — I10 Essential (primary) hypertension: Secondary | ICD-10-CM | POA: Insufficient documentation

## 2014-04-17 DIAGNOSIS — M5431 Sciatica, right side: Secondary | ICD-10-CM | POA: Insufficient documentation

## 2014-04-17 DIAGNOSIS — Z720 Tobacco use: Secondary | ICD-10-CM | POA: Insufficient documentation

## 2014-04-17 DIAGNOSIS — Z951 Presence of aortocoronary bypass graft: Secondary | ICD-10-CM | POA: Diagnosis not present

## 2014-04-17 DIAGNOSIS — Z79899 Other long term (current) drug therapy: Secondary | ICD-10-CM | POA: Insufficient documentation

## 2014-04-17 DIAGNOSIS — Z880 Allergy status to penicillin: Secondary | ICD-10-CM | POA: Insufficient documentation

## 2014-04-17 DIAGNOSIS — E785 Hyperlipidemia, unspecified: Secondary | ICD-10-CM | POA: Insufficient documentation

## 2014-04-17 DIAGNOSIS — I25119 Atherosclerotic heart disease of native coronary artery with unspecified angina pectoris: Secondary | ICD-10-CM | POA: Diagnosis not present

## 2014-04-17 DIAGNOSIS — Z7982 Long term (current) use of aspirin: Secondary | ICD-10-CM | POA: Diagnosis not present

## 2014-04-17 DIAGNOSIS — Z791 Long term (current) use of non-steroidal anti-inflammatories (NSAID): Secondary | ICD-10-CM | POA: Insufficient documentation

## 2014-04-17 DIAGNOSIS — M79662 Pain in left lower leg: Secondary | ICD-10-CM

## 2014-04-17 MED ORDER — OXYCODONE-ACETAMINOPHEN 5-325 MG PO TABS
1.0000 | ORAL_TABLET | Freq: Once | ORAL | Status: AC
  Administered 2014-04-17: 1 via ORAL
  Filled 2014-04-17: qty 1

## 2014-04-17 MED ORDER — METHYLPREDNISOLONE 4 MG PO KIT: PACK | ORAL | Status: DC

## 2014-04-17 MED ORDER — METHYLPREDNISOLONE SODIUM SUCC 125 MG IJ SOLR
80.0000 mg | Freq: Once | INTRAMUSCULAR | Status: AC
  Administered 2014-04-17: 80 mg via INTRAMUSCULAR
  Filled 2014-04-17: qty 2

## 2014-04-17 MED ORDER — HYDROMORPHONE HCL 1 MG/ML IJ SOLN
1.0000 mg | Freq: Once | INTRAMUSCULAR | Status: AC
  Administered 2014-04-17: 1 mg via INTRAMUSCULAR
  Filled 2014-04-17: qty 1

## 2014-04-17 NOTE — ED Notes (Signed)
Pt upset that his pain has not been relieved by the percocet we gave him and is asking for more pain medication states he has been waiting "all day" NP aware. Pt states he takes 40mg -60mg  of oxycodone at home. I explained to pt we do not usually give oxycodone in these doses in the ER. Dr. Estell HarpinZammit going to speak with pt.

## 2014-04-17 NOTE — ED Notes (Signed)
US at bedside, pt asking for more pain medication, NP notified no new orders obtained.

## 2014-04-17 NOTE — ED Notes (Signed)
Pt states that he has been having left leg pain starting at bottom of buttock down for a week.  Denies any new injury.

## 2014-04-17 NOTE — ED Provider Notes (Signed)
CSN: [id number]     Arrival date & time 04/17/14  1038 History   First MD Initiated Contact with Patient 04/17/14 1139     Chief Complaint  Patient presents with  . Leg Pain     (Consider location/radiation/quality/duration/timing/severity/associated sxs/prior Treatment) Patient is a 64 y.o. male presenting with leg pain. The history is provided by the patient.  Leg Pain Location:  Leg Time since incident:  1 week Injury: no   Leg location:  L leg Pain details:    Quality:  Burning and shooting   Severity:  Severe   Onset quality:  Gradual   Duration:  1 week   Timing:  Constant   Progression:  Worsening Chronicity:  Chronic Dislocation: no   Foreign body present:  No foreign bodies Prior injury to area:  No  [name] is a 64 y.o. male with hx of chronic pain presents to the ED with left leg pain. He denies any injury to the area. He states that it hurts to put pressure on the thigh and calf. He denies fever or chills.   Past Medical History  Diagnosis Date  . Chest pain   . Coronary artery disease   . Hypertension   . Arthritis   . Smoker   . Angina at rest 05/28/2011  . Abnormal nuclear stress test 05/28/2011  . Tobacco abuse 05/28/2011  . Dyslipidemia 05/28/2011   Past Surgical History  Procedure Laterality Date  . Knee arthroscopy w/ debridement    . Cervical disc surgery    . Coronary artery bypass graft  05/27/2011    Procedure: OFF PUMP CORONARY ARTERY BYPASS GRAFTING (CABG);  Surgeon: Loreli Slot, MD;  Location: [facility] OR;  Service: Open Heart Surgery;  Laterality: N/A;  . Left heart catheterization with coronary angiogram N/A 05/26/2011    Procedure: LEFT HEART CATHETERIZATION WITH CORONARY ANGIOGRAM;  Surgeon: Chrystie Nose, MD;  Location: [facility] CATH LAB;  Service: Cardiovascular;  Laterality: N/A;   History reviewed. No pertinent family history. History  Substance Use Topics  . Smoking status: Current Every Day Smoker -- 1.00 packs/day for 35  years    Types: Cigarettes  . Smokeless tobacco: Never Used  . Alcohol Use: No    Review of Systems Negative except as stated    Allergies  Penicillins  Home Medications   Prior to Admission medications   Medication Sig Start Date End Date Taking? Authorizing Provider  aspirin 81 MG tablet Take 81 mg by mouth daily.    Historical Provider, MD  atorvastatin (LIPITOR) 40 MG tablet Take 1 tablet (40 mg total) by mouth daily. 03/26/14   Runell Gess, MD  ibuprofen (ADVIL,MOTRIN) 800 MG tablet Take 800 mg by mouth daily. 04/29/13   Historical Provider, MD  methylPREDNISolone (MEDROL DOSEPAK) 4 MG tablet follow package directions 04/17/14   Janne Napoleon, NP  metoprolol succinate (TOPROL-XL) 50 MG 24 hr tablet Take 1/2 tablet ( ) by mouth daily. Take with or immediately following a meal. 03/26/14   Runell Gess, MD  Omega-3 Fatty Acids (FISH OIL PO) Take 1 capsule by mouth 3 (three) times daily.    Historical Provider, MD  oxycodone (OXY-IR) 5 MG capsule Take 5 mg by mouth every 4 (four) hours as needed. Post op RX for pain, S/P CABG    Historical Provider, MD  pantoprazole (PROTONIX) 40 MG tablet Take 1 tablet (40 mg total) by mouth daily. 03/26/14   Runell Gess, MD  valsartan (DIOVAN) 80  MG tablet Take 1 tablet (80 mg total) by mouth daily. 03/26/14   Runell GessJonathan J Berry, MD   BP 122/81 mmHg  Pulse 81  Temp(Src) 97.6 F (36.4 C) (Oral)  Resp 18  Ht 5\' 4"  (1.626 m)  Wt 220 lb (99.791 kg)  BMI 37.74 kg/m2  SpO2 97% Physical Exam  Constitutional: He is oriented to person, place, and time. He appears well-developed and well-nourished.  HENT:  Head: Normocephalic.  Eyes: EOM are normal.  Neck: Neck supple.  Cardiovascular: Normal rate.   Pulmonary/Chest: Effort normal.  Musculoskeletal:       Left lower leg: He exhibits tenderness. He exhibits no swelling, no deformity and no laceration.       Legs: Pedal pulses 2+ adequate circulation, good touch sensation. Pain in the  left sciatic area and pain with calf squeeze on the left.   Neurological: He is alert and oriented to person, place, and time. No cranial nerve deficit.  Skin: Skin is warm and dry.  Psychiatric: He has a normal mood and affect. His behavior is normal.  Nursing note and vitals reviewed.  While patient awaiting ultrasound results he states he must have something for pain. He states that the Percocet did not help that he took 40 or 60 mg of oxycodone prior to arrival to the ED.  I requested Dr. Estell HarpinZammit to see the patient.  ED Course: Dr. Estell HarpinZammit in to examine the patient and will give Diladid and Solumedrol. Will also give Rx for prednisone for home.   Procedures Labs Review [place] Venous Img Lower Unilateral Left  04/17/2014   CLINICAL DATA:  left calf pain x1 week. Previous bypass surgery. Tobacco abuse.  EXAM: LEFT LOWER EXTREMITY VENOUS DOPPLER ULTRASOUND  TECHNIQUE: Gray-scale sonography with compression, as well as color and duplex ultrasound, were performed to evaluate the deep venous system from the level of the common femoral vein through the popliteal and proximal calf veins.  COMPARISON:  09/12/2004  FINDINGS: Normal compressibility of the common femoral, superficial femoral, and popliteal veins, as well as the proximal calf veins. No filling defects to suggest DVT on grayscale or color Doppler imaging. Doppler waveforms show normal direction of venous flow, normal respiratory phasicity and response to augmentation. Visualized segments of the saphenous venous system normal in caliber and compressibility. Survey views of the contralateral common femoral vein are unremarkable.  IMPRESSION: 1. No evidence of lower extremity deep vein thrombosis, LEFT   Electronically Signed   By: Corlis Leak  Hassell M.D.   On: 04/17/2014 13:29    MDM  64 y.o. male with left sciatic pain and left calf pain. Stable for d/c without DVT on ultrasound. Patient responded well to the treatment here in the ED for his pain and  inflammation. He is to follow up with Dr. Sherwood GamblerFusco for his chronic pain.  Final diagnoses:  Calf pain, left  Sciatica, right      Janne NapoleonHope M Miliani Deike, NP 04/17/14 1712  Benny LennertJoseph L Zammit, MD 04/18/14 [phone]

## 2014-04-17 NOTE — Discharge Instructions (Signed)
Your ultrasound today shows no blood clot in the calf of your leg. We have treated you here today with Dilaudid and Solumedrol. We are giving you additional medication to take. It is important that you follow up with Dr. Sherwood GamblerFusco.

## 2014-05-25 ENCOUNTER — Encounter: Payer: Self-pay | Admitting: *Deleted

## 2014-05-25 ENCOUNTER — Encounter: Payer: Self-pay | Admitting: Cardiology

## 2014-05-25 ENCOUNTER — Ambulatory Visit (INDEPENDENT_AMBULATORY_CARE_PROVIDER_SITE_OTHER): Payer: Medicare HMO | Admitting: Cardiology

## 2014-05-25 VITALS — BP 162/85 | HR 51 | Ht 64.0 in | Wt 212.0 lb

## 2014-05-25 DIAGNOSIS — E785 Hyperlipidemia, unspecified: Secondary | ICD-10-CM

## 2014-05-25 DIAGNOSIS — I2510 Atherosclerotic heart disease of native coronary artery without angina pectoris: Secondary | ICD-10-CM

## 2014-05-25 DIAGNOSIS — I10 Essential (primary) hypertension: Secondary | ICD-10-CM | POA: Diagnosis not present

## 2014-05-25 MED ORDER — ATORVASTATIN CALCIUM 80 MG PO TABS: 80.0000 mg | ORAL_TABLET | Freq: Every day | ORAL | Status: DC

## 2014-05-25 NOTE — Patient Instructions (Signed)
Your physician wants you to follow-up in: 1 year with Dr. Lurena JoinerBranch You will receive a reminder letter in the mail two months in advance. If you don't receive a letter, please call our office to schedule the follow-up appointment.  Your physician has recommended you make the following change in your medication:   INCREASE LIPITOR 80 MG DAILY  CONTINUE ALL OTHER MEDICATIONS AS DIRECTED   Thank you for choosing [facility]!!

## 2014-05-25 NOTE — Progress Notes (Signed)
Clinical Summary [name] is a 64 y.o.male last seen by [name], this is our first visit together. He is seen for the following medical problems.   1. CAD - hx of CABG 05/2011 (LIMA-LAD) - do not see echo in system, LVEF by LVgram in 2013 was >60% - denies any chest pain. No SOB or DOE -compliant with meds  2. Hyperlipidemia - 10/2013 TC 136 TG 74 HDL 47 LDL 74 - compliant with statin  3. HTN - does not check regularly - compliant with meds    Past Medical History  Diagnosis Date  . Chest pain   . Coronary artery disease   . Hypertension   . Arthritis   . Smoker   . Angina at rest 05/28/2011  . Abnormal nuclear stress test 05/28/2011  . Tobacco abuse 05/28/2011  . Dyslipidemia 05/28/2011     Allergies  Allergen Reactions  . Penicillins Other (See Comments)    unknown     Current Outpatient Prescriptions  Medication Sig Dispense Refill  . aspirin 81 MG tablet Take 81 mg by mouth daily.    Marland Kitchen. atorvastatin (LIPITOR) 40 MG tablet Take 1 tablet (40 mg total) by mouth daily. 90 tablet 0  . ibuprofen (ADVIL,MOTRIN) 800 MG tablet Take 800 mg by mouth daily.    . methylPREDNISolone (MEDROL DOSEPAK) 4 MG tablet follow package directions 21 tablet 0  . metoprolol succinate (TOPROL-XL) 50 MG 24 hr tablet Take 1/2 tablet (25mg ) by mouth daily. Take with or immediately following a meal. 45 tablet 0  . Omega-3 Fatty Acids (FISH OIL PO) Take 1 capsule by mouth 3 (three) times daily.    Marland Kitchen. oxycodone (OXY-IR) 5 MG capsule Take 5 mg by mouth every 4 (four) hours as needed. Post op RX for pain, S/P CABG    . pantoprazole (PROTONIX) 40 MG tablet Take 1 tablet (40 mg total) by mouth daily. 90 tablet 0  . valsartan (DIOVAN) 80 MG tablet Take 1 tablet (80 mg total) by mouth daily. 90 tablet 0   No current facility-administered medications for this visit.     Past Surgical History  Procedure Laterality Date  . Knee arthroscopy w/ debridement    . Cervical disc surgery    .  Coronary artery bypass graft  05/27/2011    Procedure: OFF PUMP CORONARY ARTERY BYPASS GRAFTING (CABG);  Surgeon: Jacob SlotSteven C Hendrickson, MD;  [facility];  Service: Open Heart Surgery;  Laterality: N/A;  . Left heart catheterization with coronary angiogram N/A 05/26/2011    Procedure: LEFT HEART CATHETERIZATION WITH CORONARY ANGIOGRAM;  Surgeon: Jacob NoseKenneth C. Hilty, MD;  [facility];  Service: Cardiovascular;  Laterality: N/A;     Allergies  Allergen Reactions  . Penicillins Other (See Comments)    unknown      No family history on file.   Social History [name] reports that he has been smoking Cigarettes.  He has a 35 pack-year smoking history. He has never used smokeless tobacco. [name] reports that he does not drink alcohol.   Review of Systems CONSTITUTIONAL: No weight loss, fever, chills, weakness or fatigue.  HEENT: Eyes: No visual loss, blurred vision, double vision or yellow sclerae.No hearing loss, sneezing, congestion, runny nose or sore throat.  SKIN: No rash or itching.  CARDIOVASCULAR: per HPI RESPIRATORY: No shortness of breath, cough or sputum.  GASTROINTESTINAL: No anorexia, nausea, vomiting or diarrhea. No abdominal pain or blood.  GENITOURINARY: No burning on urination, no  polyuria NEUROLOGICAL: No headache, dizziness, syncope, paralysis, ataxia, numbness or tingling in the extremities. No change in bowel or bladder control.  MUSCULOSKELETAL: + leg pain LYMPHATICS: No enlarged nodes. No history of splenectomy.  PSYCHIATRIC: No history of depression or anxiety.  ENDOCRINOLOGIC: No reports of sweating, cold or heat intolerance. No polyuria or polydipsia.  Marland Kitchen   Physical Examination p 60 bp 138/85 Wt 212 lbs BMI 36 Gen: resting comfortably, no acute distress HEENT: no scleral icterus, pupils equal round and reactive, no palptable cervical adenopathy,  CVL RRR, no m/r/g, no JVD, no carotid bruits Resp: Clear to auscultation  bilaterally GI: abdomen is soft, non-tender, non-distended, normal bowel sounds, no hepatosplenomegaly MSK: extremities are warm, no edema.  Skin: warm, no rash Neuro:  no focal deficits Psych: appropriate affect   Diagnostic Studies 05/2011 Cath Hemodynamics: Central Aortic Pressure / Mean Aortic Pressure: 120/76 LV Pressure / LV End diastolic Pressure: 20  Left Ventriculography: EF: 60-65% Wall Motion: No focal wall motion abnormalities  Coronary Angiographic Data:  Left Main: Short left main. Distal 20% stenosis into the ostial LAD.  Left Anterior Descending (LAD): There is a 90% ostial discrete lesion of the LAD. There is mild distal LAD disease.  1st diagonal (D1): Large vessel without significant stenosis.  Circumflex (LCx): Dominant vessel. There is a moderate stenosis of the LCX up to 50% distal to OM3.  1st obtuse marginal: Small vessel, high takeoff. No significant disease.  2nd obtuse marginal: Small vessel, no significant disease 3rd obtuse marginal: Large vessel which branches into 2 vessels supplying a good portion of the lateral wall. No significant stenoses.  posterior lateral [name]: Supplies the septum.  Right Coronary Artery: Small to moderate-sized non-dominant vessel, no significant stenoses.  Impression: 1. High risk ostial LAD discrete stenosis (90%). 2. Moderate mid-distal LCx stenosis. Left dominant system. 3. LVEF >60%, no focal wall motion abnormalities.  Plan: 1. Discussed case with Jacob. Allyson Sabal and [name]. Stent would be high risk. Will ask CT surgery to evaluate for possible OPCABG. 2. Will admit due to high risk anatomy with hopefully surgical intervention in the next few days. He has not been on dual antiplatelets.  The case and results was discussed with the patient and family. The case and results was not discussed with the patient's PCP. The case and results was  discussed with the patient's Cardiologist.     Assessment and Plan   1. CAD - no current symptoms - continue risk factor modification and secondary prevention  2. HTN - at goal, continue current meds  3. Hyperlipidemia - change to atorva  daily in setting of known CAD consistent with most recent guidelines.   Request most recent labs from pcp   F/u 1year  Antoine Poche, M.D.

## 2014-06-14 ENCOUNTER — Other Ambulatory Visit (HOSPITAL_COMMUNITY): Payer: Self-pay | Admitting: Physician Assistant

## 2014-06-14 DIAGNOSIS — M5126 Other intervertebral disc displacement, lumbar region: Secondary | ICD-10-CM

## 2014-06-20 ENCOUNTER — Ambulatory Visit (HOSPITAL_COMMUNITY)
Admission: RE | Admit: 2014-06-20 | Discharge: 2014-06-20 | Disposition: A | Discharge status: Home / Self Care | Payer: Medicare HMO | Source: Ambulatory Visit | Attending: Physician Assistant | Admitting: Physician Assistant

## 2014-06-20 DIAGNOSIS — M5126 Other intervertebral disc displacement, lumbar region: Secondary | ICD-10-CM

## 2014-06-28 ENCOUNTER — Other Ambulatory Visit: Payer: Self-pay | Admitting: Neurosurgery

## 2014-07-05 ENCOUNTER — Encounter (HOSPITAL_COMMUNITY): Payer: Self-pay

## 2014-07-05 ENCOUNTER — Encounter (HOSPITAL_COMMUNITY)
Admission: RE | Admit: 2014-07-05 | Discharge: 2014-07-05 | Disposition: A | Discharge status: Home / Self Care | Payer: Medicare HMO | Source: Ambulatory Visit | Attending: Neurosurgery | Admitting: Neurosurgery

## 2014-07-05 HISTORY — DX: Depression, unspecified: F32.A

## 2014-07-05 HISTORY — DX: Cardiac arrhythmia, unspecified: I49.9

## 2014-07-05 HISTORY — DX: Major depressive disorder, single episode, unspecified: F32.9

## 2014-07-05 LAB — BASIC METABOLIC PANEL
Anion gap: 8 (ref 5–15)
BUN: 14 mg/dL (ref 6–20)
CO2: 28 mmol/L (ref 22–32)
Calcium: 9.6 mg/dL (ref 8.9–10.3)
Chloride: 104 mmol/L (ref 101–111)
Creatinine, Ser: 0.84 mg/dL (ref 0.61–1.24)
GFR calc Af Amer: >60 mL/min (ref >60)
GFR calc non Af Amer: >60 mL/min (ref >60)
Glucose, Bld: 99 mg/dL (ref 70–99)
Potassium: 4.5 mmol/L (ref 3.5–5.1)
Sodium: 140 mmol/L (ref 135–145)

## 2014-07-05 LAB — CBC
HCT: 48.2 % (ref 39.0–52.0)
Hemoglobin: 16.4 g/dL (ref 13.0–17.0)
MCH: 30.1 pg (ref 26.0–34.0)
MCHC: 34 g/dL (ref 30.0–36.0)
MCV: 88.4 fL (ref 78.0–100.0)
Platelets: 246 10*3/uL (ref 150–400)
RBC: 5.45 MIL/uL (ref 4.22–5.81)
RDW: 14.9 % (ref 11.5–15.5)
WBC: 10.4 10*3/uL (ref 4.0–10.5)

## 2014-07-05 LAB — SURGICAL PCR SCREEN
MRSA, PCR: NEGATIVE
Staphylococcus aureus: NEGATIVE

## 2014-07-05 LAB — TYPE AND SCREEN
ABO/RH(D): O POS
Antibody Screen: NEGATIVE

## 2014-07-05 NOTE — Progress Notes (Signed)
PCP is Elfredia NevinsLawrence Fusco and Cardiologist is Nanetta BattyJonathan Berry. Patient denied having any acute cardiac or pulmonary issues.

## 2014-07-05 NOTE — Progress Notes (Signed)
   07/05/14 1018  OBSTRUCTIVE SLEEP APNEA  Have you ever been diagnosed with sleep apnea through a sleep study? No  Do you snore loudly (loud enough to be heard through closed doors)?  0  Do you often feel tired, fatigued, or sleepy during the daytime? 0  Has anyone observed you stop breathing during your sleep? 0  Do you have, or are you being treated for high blood pressure? 1  BMI more than 35 kg/m2? 1  Age over 244 years old? 1  Neck circumference greater than 40 cm/16 inches? 0  Gender: 1  Obstructive Sleep Apnea Score 4   This patient has screened at risk for sleep apnea using the STOP bang tool used during a pre-surgical visit. A score of 4 or greater is at risk for sleep apnea.

## 2014-07-05 NOTE — Pre-Procedure Instructions (Signed)
[name]  07/05/2014   Your procedure is scheduled on:  Tuesday Jul 10, 2014 at 12:30 PM.  [facility] at 9:30 AM.  Call this number if you have problems the morning of surgery: [phone]   Remember:   Do not eat food or drink liquids after midnight.   Take these medicines the morning of surgery with A SIP OF WATER: Gabapentin (Neurontin), Metoprolol (Toprol XL), Oxycodone if needed, Pantoprazole (Protonix), and Tamsulosin (Flomax)   Please stop taking any Ibuprofen, Advil, Motrin, Vitamins, Herbal medications   Please bring power of attorney paperwork   Do not wear jewelry.  Do not wear lotions, powders, or cologen.   Men may shave face and neck.  Do not bring valuables to the hospital.  [facility] is not responsible for any belongings or valuables.               Contacts, dentures or bridgework may not be worn into surgery.  Leave suitcase in the car. After surgery it may be brought to your room.  For patients admitted to the hospital, discharge time is determined by your treatment team.               Patients discharged the day of surgery will not be allowed to drive home.  Name and phone number of your driver:   Special Instructions: Shower using CHG soap the night before and the morning of your surgery   Please read over the following fact sheets that you were given: Pain Booklet, Coughing and Deep Breathing, Blood Transfusion Information, MRSA Information and Surgical Site Infection Prevention

## 2014-07-06 NOTE — Progress Notes (Signed)
Anesthesia Chart Review:  Pt is 64 year old male scheduled for L4-5, L5-S1 PLIF on 07/10/2014 with Dr. Jeral FruitBotero.   Cardiologist is Dr. Allyson SabalBerry. Last office visit with cardiology was 05/25/14 with Dr. Wyline MoodBranch; follow up in 1 year was recommended.   PMH includes: CAD (s/p CABG 05/27/11; LIMA to LAD), HTN, dyslipidemia, arrhythmia (pt reports "it's ok cause no one is concerned about it"; no record of dysrhythmia in cardiologist's notes). Current smoker. BMI 36.   Medications include: ASA, lipitor, metoprolol, valsartan.   Preoperative labs reviewed.    EKG 05/25/2014: sinus bradycardia (54 bpm).   Cardiac cath 05/26/2011:  Left Main: Short left main. Distal 20% stenosis into the ostial LAD.  Left Anterior Descending (LAD): There is a 90% ostial discrete lesion of the LAD. There is mild distal LAD disease.  1st diagonal (D1): Large vessel without significant stenosis.  Circumflex (LCx): Dominant vessel. There is a moderate stenosis of the LCX up to 50% distal to OM3.  1st obtuse marginal: Small vessel, high takeoff. No significant disease.  2nd obtuse marginal: Small vessel, no significant disease 3rd obtuse marginal: Large vessel which branches into 2 vessels supplying a good portion of the lateral wall. No significant stenoses.  posterior lateral branch: Supplies the septum.  Right Coronary Artery: Small to moderate-sized non-dominant vessel, no significant stenoses.  If no changes, I anticipate pt can proceed with surgery as scheduled.   Rica Mastngela Kabbe, FNP-BC [facility]Anesthesiology Phone: [phone] 07/06/2014 1:59 PM

## 2014-07-09 MED ORDER — VANCOMYCIN HCL IN DEXTROSE 1-5 GM/200ML-% IV SOLN: 1000.0000 mg | INTRAVENOUS | Status: DC

## 2014-07-09 MED ORDER — VANCOMYCIN HCL IN DEXTROSE 1-5 GM/200ML-% IV SOLN
1000.0000 mg | INTRAVENOUS | Status: AC
  Administered 2014-07-10: 1000 mg via INTRAVENOUS
  Filled 2014-07-09: qty 200

## 2014-07-09 NOTE — H&P (Signed)
[name] is an 64 y.o. male.   Chief Complaint: pain in both legs HPI: patient who came to my office complaining of lumbar pain with radiation to both legs, pain that gets worse while he walks and a 50 feet walk took him like 5 minutes . Conservative treatment has not helped. Mri and lumbar x-rays showed spondylolisthesis at l45  With pseudodisc and stenosis at l5s1  Past Medical History  Diagnosis Date  . Chest pain   . Coronary artery disease   . Hypertension   . Arthritis   . Smoker   . Angina at rest 05/28/2011  . Abnormal nuclear stress test 05/28/2011  . Tobacco abuse 05/28/2011  . Dyslipidemia 05/28/2011  . Arrhythmia     pt stated "it's ok cause no one is concerned about it"  . Depression     Past Surgical History  Procedure Laterality Date  . Knee arthroscopy w/ debridement    . Cervical disc surgery    . Coronary artery bypass graft  05/27/2011    Procedure: OFF PUMP CORONARY ARTERY BYPASS GRAFTING (CABG);  Surgeon: Loreli SlotSteven C Hendrickson, MD;  [facility];  Service: Open Heart Surgery;  Laterality: N/A;  . Left heart catheterization with coronary angiogram N/A 05/26/2011    Procedure: LEFT HEART CATHETERIZATION WITH CORONARY ANGIOGRAM;  Surgeon: Chrystie NoseKenneth C. Hilty, MD;  [facility];  Service: Cardiovascular;  Laterality: N/A;  . Cardiac catheterization      Family History  Problem Relation Age of Onset  . Valvular heart disease  70    congenital/had sugery for mechanical valve installed   Social History:  reports that he has been smoking Cigarettes.  He started smoking about 42 years ago. He has a 35 pack-year smoking history. He has never used smokeless tobacco. He reports that he does not drink alcohol or use illicit drugs.  Allergies:  Allergies  Allergen Reactions  . Penicillins Other (See Comments)    unknown  . Ramipril Cough    No prescriptions prior to admission    No results found for this or any previous visit (from the past 48  hour(s)). No results found.  Review of Systems  Constitutional: Negative.   HENT: Negative.   Eyes: Negative.   Respiratory: Negative.   Cardiovascular: Negative.   Gastrointestinal: Negative.   Genitourinary: Negative.   Musculoskeletal: Positive for back pain.  Skin: Negative.   Neurological: Positive for focal weakness.  Endo/Heme/Allergies: Negative.   Psychiatric/Behavioral: Negative.     There were no vitals taken for this visit. Physical Exam hent, nl. Neck, anterior scar. Cv, nl. ronchii bilaterally. Abdomen, soft. Extremities ,nl. NEURO  DF left foot 3/5. PF left at 4/5. Sensory normal but he feels numb at l5,s1 dermatomes. SLR  Left at 30 degrees, right at 60.   Assessment/Plan Patient wants to go ahead with surgery. It will be decompression and fusion at l45,l5s1. He is aware of risks and benefits  Faatima Tench M 07/09/2014, 5:43 PM

## 2014-07-10 ENCOUNTER — Encounter (HOSPITAL_COMMUNITY): Payer: Self-pay | Admitting: *Deleted

## 2014-07-10 ENCOUNTER — Inpatient Hospital Stay (HOSPITAL_COMMUNITY): Payer: Medicare HMO

## 2014-07-10 ENCOUNTER — Encounter (HOSPITAL_COMMUNITY)
Admission: RE | Disposition: A | Discharge status: Home / Self Care | Payer: Medicare HMO | Source: Ambulatory Visit | Attending: Neurosurgery

## 2014-07-10 ENCOUNTER — Inpatient Hospital Stay (HOSPITAL_COMMUNITY): Payer: Medicare HMO | Admitting: Certified Registered Nurse Anesthetist

## 2014-07-10 ENCOUNTER — Inpatient Hospital Stay (HOSPITAL_COMMUNITY): Payer: Medicare HMO | Admitting: Emergency Medicine

## 2014-07-10 ENCOUNTER — Inpatient Hospital Stay (HOSPITAL_COMMUNITY)
Admission: RE | Admit: 2014-07-10 | Discharge: 2014-07-13 | DRG: 460 | Disposition: A | Discharge status: Home / Self Care | Payer: Medicare HMO | Source: Ambulatory Visit | Attending: Neurosurgery | Admitting: Neurosurgery

## 2014-07-10 DIAGNOSIS — I2510 Atherosclerotic heart disease of native coronary artery without angina pectoris: Secondary | ICD-10-CM | POA: Diagnosis present

## 2014-07-10 DIAGNOSIS — I10 Essential (primary) hypertension: Secondary | ICD-10-CM | POA: Diagnosis present

## 2014-07-10 DIAGNOSIS — Z01812 Encounter for preprocedural laboratory examination: Secondary | ICD-10-CM | POA: Diagnosis not present

## 2014-07-10 DIAGNOSIS — Z7982 Long term (current) use of aspirin: Secondary | ICD-10-CM | POA: Diagnosis not present

## 2014-07-10 DIAGNOSIS — Z6836 Body mass index (BMI) 36.0-36.9, adult: Secondary | ICD-10-CM

## 2014-07-10 DIAGNOSIS — M5117 Intervertebral disc disorders with radiculopathy, lumbosacral region: Secondary | ICD-10-CM | POA: Diagnosis present

## 2014-07-10 DIAGNOSIS — Z981 Arthrodesis status: Secondary | ICD-10-CM | POA: Diagnosis not present

## 2014-07-10 DIAGNOSIS — M4807 Spinal stenosis, lumbosacral region: Secondary | ICD-10-CM | POA: Diagnosis present

## 2014-07-10 DIAGNOSIS — E669 Obesity, unspecified: Secondary | ICD-10-CM | POA: Diagnosis present

## 2014-07-10 DIAGNOSIS — E785 Hyperlipidemia, unspecified: Secondary | ICD-10-CM | POA: Diagnosis present

## 2014-07-10 DIAGNOSIS — F17210 Nicotine dependence, cigarettes, uncomplicated: Secondary | ICD-10-CM | POA: Diagnosis present

## 2014-07-10 DIAGNOSIS — Z419 Encounter for procedure for purposes other than remedying health state, unspecified: Secondary | ICD-10-CM

## 2014-07-10 DIAGNOSIS — Z951 Presence of aortocoronary bypass graft: Secondary | ICD-10-CM | POA: Diagnosis not present

## 2014-07-10 DIAGNOSIS — M4316 Spondylolisthesis, lumbar region: Principal | ICD-10-CM | POA: Diagnosis present

## 2014-07-10 SURGERY — POSTERIOR LUMBAR FUSION 2 LEVEL
Anesthesia: General | Site: Back

## 2014-07-10 MED ORDER — ROCURONIUM BROMIDE 100 MG/10ML IV SOLN
INTRAVENOUS | Status: DC | PRN
  Administered 2014-07-10: 50 mg via INTRAVENOUS

## 2014-07-10 MED ORDER — ONDANSETRON HCL 4 MG/2ML IJ SOLN: 4.0000 mg | INTRAMUSCULAR | Status: DC | PRN

## 2014-07-10 MED ORDER — VANCOMYCIN HCL 1000 MG IV SOLR
INTRAVENOUS | Status: AC
  Filled 2014-07-10: qty 2000

## 2014-07-10 MED ORDER — FENTANYL CITRATE (PF) 100 MCG/2ML IJ SOLN
INTRAMUSCULAR | Status: DC | PRN
  Administered 2014-07-10: 50 ug via INTRAVENOUS
  Administered 2014-07-10: 25 ug via INTRAVENOUS
  Administered 2014-07-10: 100 ug via INTRAVENOUS
  Administered 2014-07-10: 75 ug via INTRAVENOUS

## 2014-07-10 MED ORDER — LIDOCAINE HCL (CARDIAC) 20 MG/ML IV SOLN
INTRAVENOUS | Status: AC
  Filled 2014-07-10: qty 5

## 2014-07-10 MED ORDER — DEXAMETHASONE SODIUM PHOSPHATE 4 MG/ML IJ SOLN
INTRAMUSCULAR | Status: DC | PRN
  Administered 2014-07-10: 8 mg via INTRAVENOUS

## 2014-07-10 MED ORDER — HYDROMORPHONE HCL 1 MG/ML IJ SOLN
INTRAMUSCULAR | Status: AC
  Filled 2014-07-10: qty 1

## 2014-07-10 MED ORDER — OXYCODONE HCL 5 MG/5ML PO SOLN: 5.0000 mg | Freq: Once | ORAL | Status: DC | PRN

## 2014-07-10 MED ORDER — STERILE WATER FOR INJECTION IJ SOLN
INTRAMUSCULAR | Status: AC
  Filled 2014-07-10: qty 10

## 2014-07-10 MED ORDER — MIDAZOLAM HCL 2 MG/2ML IJ SOLN
INTRAMUSCULAR | Status: AC
  Filled 2014-07-10: qty 2

## 2014-07-10 MED ORDER — SODIUM CHLORIDE 0.9 % IV SOLN
INTRAVENOUS | Status: DC
  Administered 2014-07-10: 17:00:00 via INTRAVENOUS

## 2014-07-10 MED ORDER — PHENOL 1.4 % MT LIQD
1.0000 | OROMUCOSAL | Status: DC | PRN
Start: 2014-07-10 — End: 2014-07-13

## 2014-07-10 MED ORDER — PROPOFOL 10 MG/ML IV BOLUS
INTRAVENOUS | Status: DC | PRN
  Administered 2014-07-10: 170 mg via INTRAVENOUS

## 2014-07-10 MED ORDER — FENTANYL CITRATE (PF) 250 MCG/5ML IJ SOLN
INTRAMUSCULAR | Status: AC
  Filled 2014-07-10: qty 5

## 2014-07-10 MED ORDER — 0.9 % SODIUM CHLORIDE (POUR BTL) OPTIME
TOPICAL | Status: DC | PRN
  Administered 2014-07-10: 1000 mL

## 2014-07-10 MED ORDER — DOCUSATE SODIUM 100 MG PO CAPS
100.0000 mg | ORAL_CAPSULE | Freq: Two times a day (BID) | ORAL | Status: DC
  Administered 2014-07-10 – 2014-07-12 (×5): 100 mg via ORAL
  Filled 2014-07-10 (×5): qty 1

## 2014-07-10 MED ORDER — BUPIVACAINE LIPOSOME 1.3 % IJ SUSP
INTRAMUSCULAR | Status: DC | PRN
  Administered 2014-07-10: 20 mL

## 2014-07-10 MED ORDER — SODIUM CHLORIDE 0.9 % IV SOLN: 250.0000 mL | INTRAVENOUS | Status: DC

## 2014-07-10 MED ORDER — CEFAZOLIN SODIUM 1-5 GM-% IV SOLN: 1.0000 g | Freq: Three times a day (TID) | INTRAVENOUS | Status: DC

## 2014-07-10 MED ORDER — NEOSTIGMINE METHYLSULFATE 10 MG/10ML IV SOLN
INTRAVENOUS | Status: DC | PRN
  Administered 2014-07-10: 5 mg via INTRAVENOUS

## 2014-07-10 MED ORDER — DEXAMETHASONE SODIUM PHOSPHATE 4 MG/ML IJ SOLN
INTRAMUSCULAR | Status: AC
  Filled 2014-07-10: qty 2

## 2014-07-10 MED ORDER — LACTATED RINGERS IV SOLN
INTRAVENOUS | Status: DC
  Administered 2014-07-10 (×4): via INTRAVENOUS

## 2014-07-10 MED ORDER — GLYCOPYRROLATE 0.2 MG/ML IJ SOLN
INTRAMUSCULAR | Status: DC | PRN
  Administered 2014-07-10: .9 mg via INTRAVENOUS

## 2014-07-10 MED ORDER — EPHEDRINE SULFATE 50 MG/ML IJ SOLN
INTRAMUSCULAR | Status: DC | PRN
  Administered 2014-07-10 (×4): 10 mg via INTRAVENOUS

## 2014-07-10 MED ORDER — ONDANSETRON HCL 4 MG/2ML IJ SOLN
INTRAMUSCULAR | Status: AC
  Filled 2014-07-10: qty 2

## 2014-07-10 MED ORDER — VANCOMYCIN HCL 1000 MG IV SOLR
INTRAVENOUS | Status: DC | PRN
  Administered 2014-07-10 (×2): 1000 mg via TOPICAL

## 2014-07-10 MED ORDER — MENTHOL 3 MG MT LOZG: 1.0000 | LOZENGE | OROMUCOSAL | Status: DC | PRN

## 2014-07-10 MED ORDER — MORPHINE SULFATE (PF) 1 MG/ML IV SOLN: INTRAVENOUS | Status: DC

## 2014-07-10 MED ORDER — LIDOCAINE HCL (CARDIAC) 20 MG/ML IV SOLN
INTRAVENOUS | Status: DC | PRN
  Administered 2014-07-10: 60 mg via INTRAVENOUS

## 2014-07-10 MED ORDER — SODIUM CHLORIDE 0.9 % IJ SOLN: 3.0000 mL | INTRAMUSCULAR | Status: DC | PRN

## 2014-07-10 MED ORDER — ROCURONIUM BROMIDE 50 MG/5ML IV SOLN
INTRAVENOUS | Status: AC
  Filled 2014-07-10: qty 1

## 2014-07-10 MED ORDER — ONDANSETRON HCL 4 MG/2ML IJ SOLN
INTRAMUSCULAR | Status: DC | PRN
Start: 2014-07-10 — End: 2014-07-10
  Administered 2014-07-10: 4 mg via INTRAVENOUS

## 2014-07-10 MED ORDER — NALOXONE HCL 0.4 MG/ML IJ SOLN
0.4000 mg | INTRAMUSCULAR | Status: DC | PRN
Start: 2014-07-10 — End: 2014-07-10

## 2014-07-10 MED ORDER — NEOSTIGMINE METHYLSULFATE 10 MG/10ML IV SOLN
INTRAVENOUS | Status: AC
  Filled 2014-07-10: qty 1

## 2014-07-10 MED ORDER — ONDANSETRON HCL 4 MG/2ML IJ SOLN: 4.0000 mg | Freq: Four times a day (QID) | INTRAMUSCULAR | Status: DC | PRN

## 2014-07-10 MED ORDER — PHENYLEPHRINE HCL 10 MG/ML IJ SOLN
10.0000 mg | INTRAVENOUS | Status: DC | PRN
  Administered 2014-07-10: 20 ug/min via INTRAVENOUS

## 2014-07-10 MED ORDER — THROMBIN 20000 UNITS EX SOLR
CUTANEOUS | Status: DC | PRN
  Administered 2014-07-10: 11:00:00 via TOPICAL

## 2014-07-10 MED ORDER — SODIUM CHLORIDE 0.9 % IJ SOLN: 9.0000 mL | INTRAMUSCULAR | Status: DC | PRN

## 2014-07-10 MED ORDER — DIPHENHYDRAMINE HCL 50 MG/ML IJ SOLN: 12.5000 mg | Freq: Four times a day (QID) | INTRAMUSCULAR | Status: DC | PRN

## 2014-07-10 MED ORDER — OXYCODONE HCL 5 MG PO TABS
30.0000 mg | ORAL_TABLET | Freq: Four times a day (QID) | ORAL | Status: DC | PRN
  Administered 2014-07-10 – 2014-07-13 (×8): 30 mg via ORAL
  Filled 2014-07-10 (×9): qty 6

## 2014-07-10 MED ORDER — POLYETHYLENE GLYCOL 3350 17 G PO PACK: 17.0000 g | PACK | Freq: Every day | ORAL | Status: DC | PRN

## 2014-07-10 MED ORDER — OXYCODONE HCL 5 MG PO TABS: 5.0000 mg | ORAL_TABLET | Freq: Once | ORAL | Status: DC | PRN

## 2014-07-10 MED ORDER — MORPHINE SULFATE (PF) 1 MG/ML IV SOLN
INTRAVENOUS | Status: AC
  Administered 2014-07-10: 16:00:00
  Filled 2014-07-10: qty 25

## 2014-07-10 MED ORDER — PHENYLEPHRINE HCL 10 MG/ML IJ SOLN
INTRAMUSCULAR | Status: DC | PRN
  Administered 2014-07-10 (×3): 80 ug via INTRAVENOUS

## 2014-07-10 MED ORDER — BUPIVACAINE LIPOSOME 1.3 % IJ SUSP
20.0000 mL | INTRAMUSCULAR | Status: AC
  Filled 2014-07-10: qty 20

## 2014-07-10 MED ORDER — MIDAZOLAM HCL 5 MG/5ML IJ SOLN
INTRAMUSCULAR | Status: DC | PRN
  Administered 2014-07-10: 2 mg via INTRAVENOUS

## 2014-07-10 MED ORDER — GLYCOPYRROLATE 0.2 MG/ML IJ SOLN
INTRAMUSCULAR | Status: AC
  Filled 2014-07-10: qty 5

## 2014-07-10 MED ORDER — OXYCODONE-ACETAMINOPHEN 5-325 MG PO TABS
1.0000 | ORAL_TABLET | ORAL | Status: DC | PRN
  Administered 2014-07-10 – 2014-07-11 (×2): 2 via ORAL
  Administered 2014-07-12: 1 via ORAL
  Filled 2014-07-10: qty 2
  Filled 2014-07-10: qty 1
  Filled 2014-07-10 (×2): qty 2

## 2014-07-10 MED ORDER — DIPHENHYDRAMINE HCL 12.5 MG/5ML PO ELIX: 12.5000 mg | ORAL_SOLUTION | Freq: Four times a day (QID) | ORAL | Status: DC | PRN

## 2014-07-10 MED ORDER — EPHEDRINE SULFATE 50 MG/ML IJ SOLN
INTRAMUSCULAR | Status: AC
  Filled 2014-07-10: qty 1

## 2014-07-10 MED ORDER — PROPOFOL 10 MG/ML IV BOLUS
INTRAVENOUS | Status: AC
  Filled 2014-07-10: qty 20

## 2014-07-10 MED ORDER — ACETAMINOPHEN 650 MG RE SUPP: 650.0000 mg | RECTAL | Status: DC | PRN

## 2014-07-10 MED ORDER — PHENYLEPHRINE 40 MCG/ML (10ML) SYRINGE FOR IV PUSH (FOR BLOOD PRESSURE SUPPORT)
PREFILLED_SYRINGE | INTRAVENOUS | Status: AC
  Filled 2014-07-10: qty 30

## 2014-07-10 MED ORDER — SODIUM CHLORIDE 0.9 % IJ SOLN
3.0000 mL | Freq: Two times a day (BID) | INTRAMUSCULAR | Status: DC
  Administered 2014-07-11 – 2014-07-12 (×2): 3 mL via INTRAVENOUS

## 2014-07-10 MED ORDER — DIAZEPAM 5 MG PO TABS
5.0000 mg | ORAL_TABLET | Freq: Four times a day (QID) | ORAL | Status: DC | PRN
  Administered 2014-07-11: 5 mg via ORAL
  Filled 2014-07-10: qty 1

## 2014-07-10 MED ORDER — ZOLPIDEM TARTRATE 5 MG PO TABS: 5.0000 mg | ORAL_TABLET | Freq: Every evening | ORAL | Status: DC | PRN

## 2014-07-10 MED ORDER — HYDROMORPHONE HCL 1 MG/ML IJ SOLN
0.2500 mg | INTRAMUSCULAR | Status: DC | PRN
  Administered 2014-07-10 (×2): 0.5 mg via INTRAVENOUS

## 2014-07-10 MED ORDER — ACETAMINOPHEN 325 MG PO TABS: 650.0000 mg | ORAL_TABLET | ORAL | Status: DC | PRN

## 2014-07-10 MED ORDER — VANCOMYCIN HCL 10 G IV SOLR
1250.0000 mg | Freq: Two times a day (BID) | INTRAVENOUS | Status: AC
  Administered 2014-07-10 – 2014-07-11 (×2): 1250 mg via INTRAVENOUS
  Filled 2014-07-10 (×2): qty 1250

## 2014-07-10 SURGICAL SUPPLY — 81 items
APL SKNCLS STERI-STRIP NONHPOA (GAUZE/BANDAGES/DRESSINGS) ×1
BENZOIN TINCTURE PRP APPL 2/3 (GAUZE/BANDAGES/DRESSINGS) ×3 IMPLANT
BLADE CLIPPER SURG (BLADE) ×2 IMPLANT
BUR ACORN 6.0 (BURR) ×2 IMPLANT
BUR ACORN 6.0MM (BURR) ×1
BUR MATCHSTICK NEURO 3.0 LAGG (BURR) ×3 IMPLANT
CANISTER SUCT 3000ML PPV (MISCELLANEOUS) ×3 IMPLANT
CAP LOCKING THREADED (Cap) ×12 IMPLANT
CLOSURE WOUND 1/2 X4 (GAUZE/BANDAGES/DRESSINGS) ×1
CONT SPEC 4OZ CLIKSEAL STRL BL (MISCELLANEOUS) ×3 IMPLANT
COVER BACK TABLE 60X90IN (DRAPES) ×3 IMPLANT
CROSSLINK SPINAL FUSION (Cage) ×2 IMPLANT
DRAPE C-ARM 42X72 X-RAY (DRAPES) ×6 IMPLANT
DRAPE LAPAROTOMY 100X72X124 (DRAPES) ×3 IMPLANT
DRAPE POUCH INSTRU U-SHP 10X18 (DRAPES) ×3 IMPLANT
DRSG OPSITE POSTOP 4X8 (GAUZE/BANDAGES/DRESSINGS) ×2 IMPLANT
DRSG PAD ABDOMINAL 8X10 ST (GAUZE/BANDAGES/DRESSINGS) IMPLANT
DURAPREP 26ML APPLICATOR (WOUND CARE) ×3 IMPLANT
ELECT BLADE 4.0 EZ CLEAN MEGAD (MISCELLANEOUS) ×3
ELECT REM PT RETURN 9FT ADLT (ELECTROSURGICAL) ×3
ELECTRODE BLDE 4.0 EZ CLN MEGD (MISCELLANEOUS) IMPLANT
ELECTRODE REM PT RTRN 9FT ADLT (ELECTROSURGICAL) ×1 IMPLANT
EVACUATOR 1/8 PVC DRAIN (DRAIN) IMPLANT
EVACUATOR 3/16  PVC DRAIN (DRAIN) ×2
EVACUATOR 3/16 PVC DRAIN (DRAIN) IMPLANT
GAUZE SPONGE 4X4 12PLY STRL (GAUZE/BANDAGES/DRESSINGS) ×1 IMPLANT
GAUZE SPONGE 4X4 16PLY XRAY LF (GAUZE/BANDAGES/DRESSINGS) ×3 IMPLANT
GLOVE BIOGEL M 8.0 STRL (GLOVE) ×5 IMPLANT
GLOVE BIOGEL PI IND STRL 7.5 (GLOVE) IMPLANT
GLOVE BIOGEL PI IND STRL 8.5 (GLOVE) IMPLANT
GLOVE BIOGEL PI INDICATOR 7.5 (GLOVE) ×4
GLOVE BIOGEL PI INDICATOR 8.5 (GLOVE) ×2
GLOVE ECLIPSE 8.5 STRL (GLOVE) ×3 IMPLANT
GLOVE EXAM NITRILE LRG STRL (GLOVE) IMPLANT
GLOVE EXAM NITRILE MD LF STRL (GLOVE) IMPLANT
GLOVE EXAM NITRILE XL STR (GLOVE) IMPLANT
GLOVE EXAM NITRILE XS STR PU (GLOVE) IMPLANT
GLOVE SURG SS PI 7.0 STRL IVOR (GLOVE) ×6 IMPLANT
GOWN STRL REUS W/ TWL LRG LVL3 (GOWN DISPOSABLE) ×1 IMPLANT
GOWN STRL REUS W/ TWL XL LVL3 (GOWN DISPOSABLE) IMPLANT
GOWN STRL REUS W/TWL 2XL LVL3 (GOWN DISPOSABLE) ×2 IMPLANT
GOWN STRL REUS W/TWL LRG LVL3 (GOWN DISPOSABLE) ×3
GOWN STRL REUS W/TWL XL LVL3 (GOWN DISPOSABLE) ×6
KIT BASIN OR (CUSTOM PROCEDURE TRAY) ×3 IMPLANT
KIT INFUSE LRG II (Orthopedic Implant) ×2 IMPLANT
KIT ROOM TURNOVER OR (KITS) ×3 IMPLANT
NDL HYPO 18GX1.5 BLUNT FILL (NEEDLE) IMPLANT
NDL HYPO 21X1.5 SAFETY (NEEDLE) IMPLANT
NDL HYPO 25X1 1.5 SAFETY (NEEDLE) IMPLANT
NEEDLE HYPO 18GX1.5 BLUNT FILL (NEEDLE) IMPLANT
NEEDLE HYPO 21X1.5 SAFETY (NEEDLE) ×3 IMPLANT
NEEDLE HYPO 25X1 1.5 SAFETY (NEEDLE) IMPLANT
NS IRRIG 1000ML POUR BTL (IV SOLUTION) ×3 IMPLANT
PACK LAMINECTOMY NEURO (CUSTOM PROCEDURE TRAY) ×3 IMPLANT
PAD ARMBOARD 7.5X6 YLW CONV (MISCELLANEOUS) ×11 IMPLANT
PATTIES SURGICAL .5 X1 (DISPOSABLE) ×1 IMPLANT
PATTIES SURGICAL .5 X3 (DISPOSABLE) IMPLANT
PATTIES SURGICAL 1X1 (DISPOSABLE) ×2 IMPLANT
ROD 65MM SPINAL (Rod) ×2 IMPLANT
ROD 70MM SPINAL (Rod) ×2 IMPLANT
ROD SPNL 5.5 CREO TI 65 (Rod) IMPLANT
SCREW POLY THRD CREO 6.5X40 (Screw) ×8 IMPLANT
SCREW SPINE CREO 6.5X50 (Screw) ×6 IMPLANT
SPACER RISE 8X22 11-17MM-15 (Spacer) ×4 IMPLANT
SPONGE LAP 4X18 X RAY DECT (DISPOSABLE) IMPLANT
SPONGE NEURO XRAY DETECT 1X3 (DISPOSABLE) IMPLANT
SPONGE SURGIFOAM ABS GEL 100 (HEMOSTASIS) ×3 IMPLANT
STRIP CLOSURE SKIN 1/2X4 (GAUZE/BANDAGES/DRESSINGS) ×2 IMPLANT
SUT VIC AB 1 CT1 18XBRD ANBCTR (SUTURE) ×2 IMPLANT
SUT VIC AB 1 CT1 8-18 (SUTURE) ×6
SUT VIC AB 2-0 CP2 18 (SUTURE) ×5 IMPLANT
SUT VIC AB 3-0 SH 8-18 (SUTURE) ×3 IMPLANT
SYR 20CC LL (SYRINGE) ×2 IMPLANT
SYR 20ML ECCENTRIC (SYRINGE) ×3 IMPLANT
SYR 5ML LL (SYRINGE) IMPLANT
TAPE CLOTH SURG 4X10 WHT LF (GAUZE/BANDAGES/DRESSINGS) ×3 IMPLANT
TOWEL OR 17X24 6PK STRL BLUE (TOWEL DISPOSABLE) ×3 IMPLANT
TOWEL OR 17X26 10 PK STRL BLUE (TOWEL DISPOSABLE) ×3 IMPLANT
TRAY FOLEY CATH 14FRSI W/METER (CATHETERS) ×1 IMPLANT
TRAY FOLEY CATH 16FRSI W/METER (SET/KITS/TRAYS/PACK) ×2 IMPLANT
WATER STERILE IRR 1000ML POUR (IV SOLUTION) ×3 IMPLANT

## 2014-07-10 NOTE — Progress Notes (Signed)
Patient requested to have PCA discontinue and his home medication, Roxicodone 30mg  order. Spoke with Dr. Danielle DessElsner. Orders received.   Sim BoastHavy, RN

## 2014-07-10 NOTE — Transfer of Care (Signed)
Immediate Anesthesia Transfer of Care Note  Patient: [name]  Procedure(s) Performed: Procedure(s) with comments: Lumbar four-five Posterior Lumbar Interbody Fusion, Posterior Lateral Arthrodesis Lumbar four-five, Lumbar five-Sacral one with BMP and Autograft (N/A) - L4-5 L5-S1 Posterior lumbar interbody fusion  Patient Location: PACU  Anesthesia Type:General  Level of Consciousness: awake, alert  and oriented  Airway & Oxygen Therapy: Patient Spontanous Breathing and Patient connected to nasal cannula oxygen  Post-op Assessment: Report given to RN, Post -op Vital signs reviewed and stable and Patient moving all extremities X 4  Post vital signs: Reviewed and stable  Last Vitals:  Filed Vitals:   07/10/14 1322  BP:   Pulse:   Temp: 36.6 C  Resp:     Complications: No apparent anesthesia complications

## 2014-07-10 NOTE — Progress Notes (Signed)
ANTIBIOTIC CONSULT NOTE - INITIAL  Pharmacy Consult for Vancomycin Indication: post-op prophylaxis  Allergies  Allergen Reactions  . Penicillins Other (See Comments)    unknown  . Ramipril Cough    Patient Measurements: Weight: 212 lb (96.163 kg)  Vital Signs: Temp: 97.9 F (36.6 C) (05/10 1559) Temp Source: Oral (05/10 0757) BP: 106/68 mmHg (05/10 1559) Pulse Rate: 87 (05/10 1559) Intake/Output from previous day:   Intake/Output from this shift: Total I/O In: 2200 [I.V.:2000; Blood:200] Out: 750 [Urine:250; Blood:500]  Labs:  SCr 0.84 (07/05/14)  Microbiology: Recent Results (from the past 720 hour(s))  Surgical pcr screen     Status: None   Collection Time: 07/05/14 10:55 AM  Result Value Ref Range Status   MRSA, PCR NEGATIVE NEGATIVE Final   Staphylococcus aureus NEGATIVE NEGATIVE Final    Comment:        The Xpert SA Assay (FDA approved for NASAL specimens in patients over 64 years of age), is one component of a comprehensive surveillance program.  Test performance has been validated by [facility] for patients greater than or equal to 936 year old. It is not intended to diagnose infection nor to guide or monitor treatment.     Medical History: Past Medical History  Diagnosis Date  . Chest pain   . Coronary artery disease   . Hypertension   . Arthritis   . Smoker   . Angina at rest 05/28/2011  . Abnormal nuclear stress test 05/28/2011  . Tobacco abuse 05/28/2011  . Dyslipidemia 05/28/2011  . Arrhythmia     pt stated "it's ok cause no one is concerned about it"  . Depression     Medications:  Prescriptions prior to admission  Medication Sig Dispense Refill Last Dose  . aspirin 81 MG tablet Take 81 mg by mouth daily.   Past Month at Unknown time  . atorvastatin (LIPITOR) 80 MG tablet Take 1 tablet (80 mg total) by mouth daily. 90 tablet 3 07/09/2014 at Unknown time  . gabapentin (NEURONTIN) 100 MG capsule Take 200 mg by mouth 2 (two) times daily.     07/10/2014 at Unknown time  . ibuprofen (ADVIL,MOTRIN) 800 MG tablet Take 800 mg by mouth daily.   Past Month at Unknown time  . metoprolol succinate (TOPROL-XL) 50 MG 24 hr tablet Take 1/2 tablet (25mg ) by mouth daily. Take with or immediately following a meal. (Patient taking differently: Take 25 mg by mouth daily. Take 1/2 tablet (25mg ) by mouth daily. Take with or immediately following a meal.) 45 tablet 0 07/10/2014 at 0400  . oxycodone (ROXICODONE) 30 MG immediate release tablet Take 1 tablet by mouth every 6 (six) hours as needed.   07/10/2014 at Unknown time  . pantoprazole (PROTONIX) 40 MG tablet Take 1 tablet (40 mg total) by mouth daily. 90 tablet 0 07/10/2014 at Unknown time  . tamsulosin (FLOMAX) 0.4 MG CAPS capsule Take 0.4 mg by mouth daily.   07/10/2014 at Unknown time  . valsartan (DIOVAN) 80 MG tablet Take 1 tablet (80 mg total) by mouth daily. 90 tablet 0 07/10/2014 at Unknown time   Assessment: 64 yo M s/p back surgery to receive Vancomycin for post-op prophylaxis.  Pt received Vancomycin 1gm pre-op at 0945 today.  CrCl ~ 90 ml/min.  Goal of Therapy:  Vancomycin trough level 10-15 mcg/ml  Plan:  Vancomycin 1250mg  IV q12h x 2 doses (to complete 24 hours of prophylaxis). No levels needed.  Toys 'R' UsKimberly Derrian Rodak, Pharm.D., BCPS Clinical Pharmacist Pager [phone] 07/10/2014  4:28 PM

## 2014-07-10 NOTE — Addendum Note (Signed)
Addendum  created 07/10/14 1642 by Reine Justokoshi T Pierre Cumpton, CRNA   Modules edited: Charges VN

## 2014-07-10 NOTE — Anesthesia Procedure Notes (Signed)
Procedure Name: Intubation Date/Time: 07/10/2014 9:56 AM Performed by: Reine JustFLOWERS, Ellisyn Icenhower T Pre-anesthesia Checklist: Patient identified, Timeout performed, Emergency Drugs available, Suction available and Patient being monitored Patient Re-evaluated:Patient Re-evaluated prior to inductionOxygen Delivery Method: Circle system utilized and Simple face mask Preoxygenation: Pre-oxygenation with 100% oxygen Intubation Type: IV induction Ventilation: Mask ventilation without difficulty Laryngoscope Size: Glidescope and 4 Grade View: Grade I Tube type: Oral Tube size: 7.5 mm Number of attempts: 2 Airway Equipment and Method: Patient positioned with wedge pillow,  Stylet and Video-laryngoscopy Placement Confirmation: ETT inserted through vocal cords under direct vision,  positive ETCO2 and breath sounds checked- equal and bilateral Secured at: 22 cm Tube secured with: Tape Dental Injury: Teeth and Oropharynx as per pre-operative assessment  Difficulty Due To: Difficult Airway- due to dentition and Difficult Airway- due to limited oral opening Future Recommendations: Recommend- induction with short-acting agent, and alternative techniques readily available

## 2014-07-10 NOTE — Anesthesia Preprocedure Evaluation (Signed)
Anesthesia Evaluation  Patient identified by MRN, date of birth, ID band Patient awake    Reviewed: Allergy & Precautions, NPO status , Patient's Chart, lab work & pertinent test results  Airway Mallampati: II   Neck ROM: full    Dental   Pulmonary former smoker,  breath sounds clear to auscultation        Cardiovascular hypertension, + CAD and + CABG Rhythm:regular Rate:Normal     Neuro/Psych Depression    GI/Hepatic   Endo/Other  obese  Renal/GU      Musculoskeletal  (+) Arthritis -,   Abdominal   Peds  Hematology   Anesthesia Other Findings   Reproductive/Obstetrics                             Anesthesia Physical Anesthesia Plan  ASA: III  Anesthesia Plan: General   Post-op Pain Management:    Induction: Intravenous  Airway Management Planned: Oral ETT  Additional Equipment:   Intra-op Plan:   Post-operative Plan: Extubation in OR  Informed Consent: I have reviewed the patients History and Physical, chart, labs and discussed the procedure including the risks, benefits and alternatives for the proposed anesthesia with the patient or authorized representative who has indicated his/her understanding and acceptance.     Plan Discussed with: CRNA, Anesthesiologist and Surgeon  Anesthesia Plan Comments:         Anesthesia Quick Evaluation

## 2014-07-10 NOTE — Anesthesia Postprocedure Evaluation (Signed)
Anesthesia Post Note  Patient: [name]  Procedure(s) Performed: Procedure(s) (LRB): Lumbar four-five Posterior Lumbar Interbody Fusion, Posterior Lateral Arthrodesis Lumbar four-five, Lumbar five-Sacral one with BMP and Autograft (N/A)  Anesthesia type: General  Patient location: PACU  Post pain: Pain level controlled and Adequate analgesia  Post assessment: Post-op Vital signs reviewed, Patient's Cardiovascular Status Stable, Respiratory Function Stable, Patent Airway and Pain level controlled  Last Vitals:  Filed Vitals:   07/10/14 1424  BP: 127/81  Pulse: 76  Temp:   Resp: 13    Post vital signs: Reviewed and stable  Level of consciousness: awake, alert  and oriented  Complications: No apparent anesthesia complications

## 2014-07-10 NOTE — Progress Notes (Signed)
Patient arrived to unit from PACU alert/oriented. Safety precautions and orders reviewed. Call light and possession within reach. Will continue to monitor.  Sim BoastHavy, RN

## 2014-07-11 MED FILL — Sodium Chloride Irrigation Soln 0.9%: Qty: 3000 | Status: AC

## 2014-07-11 MED FILL — Sodium Chloride IV Soln 0.9%: INTRAVENOUS | Qty: 1000 | Status: AC

## 2014-07-11 MED FILL — Heparin Sodium (Porcine) Inj 1000 Unit/ML: INTRAMUSCULAR | Qty: 30 | Status: AC

## 2014-07-11 NOTE — Evaluation (Signed)
Occupational Therapy Evaluation Patient Details Name: [name] MRN: [id number] DOB: 03/20/1950 Today's Date: 07/11/2014    History of Present Illness 64 yo male s/p L4-5 L5- s1 diskectomy with expandable cage, laminectomy L4-5 facetectomy and pedicle screws posterolateral arthrodeiss with autograft and BMP   Clinical Impression   Patient is s/p L4-5 L5-s1 posterolateral arthodesis with autograft and BMP, pedicle screws surgery resulting in functional limitations due to the deficits listed below (see OT problem list). PTA living at home alone driving and independent with ADLS. Patient will benefit from skilled OT acutely to increase independence and safety with ADLS to allow discharge home without follow up needs.  All education is complete and patient indicates understanding. Pt can be MOD I to ambulate in room and hall once IV medication d/c and no more IV pole. RN Tyron Russellyeni informed of this MOD I status upon d/c      Follow Up Recommendations  No OT follow up    Equipment Recommendations  None recommended by OT    Recommendations for Other Services       Precautions / Restrictions Precautions Precautions: Back Precaution Comments: back handout provided and reviewed in detail Required Braces or Orthoses: Spinal Brace Spinal Brace: Lumbar corset;Applied in sitting position      Mobility Bed Mobility               General bed mobility comments: pt in hall with PT at beginning of session  Transfers Overall transfer level: Modified independent                    Balance                                            ADL Overall ADL's : Needs assistance/impaired Eating/Feeding: Independent   Grooming: Oral care;Independent;Sitting   Upper Body Bathing: Modified independent;Sitting           Lower Body Dressing: Supervision/safety;Sit to/from stand Lower Body Dressing Details (indicate cue type and reason): able to cross bil  LE Toilet Transfer: Modified Independent   Toileting- Clothing Manipulation and Hygiene: Modified independent   Tub/ Shower Transfer: Walk-in Public affairs consultantshower;Supervision/safety Tub/Shower Transfer Details (indicate cue type and reason): pt educated on shower transfer and return demo. pt has a friend that can come assist with first shower if needed. Pt educated on using luke warm water for first bath Functional mobility during ADLs: Modified independent General ADL Comments: Pt educated on adls with back precautions, transfer into car, home setup, and pain management in the PM hours. Pt without further questions. Pt does express R LE aching with pain     Vision     Perception     Praxis      Pertinent Vitals/Pain Pain Assessment: Faces Faces Pain Scale: Hurts a little bit Pain Location: R LE Pain Descriptors / Indicators: Aching Pain Intervention(s): Monitored during session;Premedicated before session;Repositioned     Hand Dominance Right   Extremity/Trunk Assessment Upper Extremity Assessment Upper Extremity Assessment: Overall WFL for tasks assessed   Lower Extremity Assessment Lower Extremity Assessment: Defer to PT evaluation   Cervical / Trunk Assessment Cervical / Trunk Assessment: Other exceptions (s/p surg)   Communication Communication Communication: HOH (wear glasses all the time)   Cognition Arousal/Alertness: Awake/alert Behavior During Therapy: WFL for tasks assessed/performed Overall Cognitive Status: Within Functional Limits for tasks assessed  General Comments       Exercises       Shoulder Instructions      Home Living Family/patient expects to be discharged to:: Private residence Living Arrangements: Alone Available Help at Discharge: Friend(s);Available PRN/intermittently Type of Home: House Home Access: Stairs to enter Entergy CorporationEntrance Stairs-Number of Steps: 2 Entrance Stairs-Rails: None Home Layout: One level      Bathroom Shower/Tub: Producer, television/film/videoWalk-in shower   Bathroom Toilet: Standard     Home Equipment: None   Additional Comments: standard toilets, walk in shower      Prior Functioning/Environment Level of Independence: Independent with assistive device(s)        Comments: use cane     OT Diagnosis: Generalized weakness;Acute pain   OT Problem List:     OT Treatment/Interventions:      OT Goals(Current goals can be found in the care plan section) Acute Rehab OT Goals Potential to Achieve Goals: Good  OT Frequency:     Barriers to D/C:            Co-evaluation              End of Session Equipment Utilized During Treatment: Gait belt;Rolling walker;Back brace Nurse Communication: Mobility status;Precautions  Activity Tolerance: Patient tolerated treatment well Patient left: in chair;with call bell/phone within reach   Time: 0750-0800 ([phone]) OT arriving for questions/ education- Ot dove tailed PT with patient to complete shower transfer and in room education on adls with back precautions OT Time Calculation (min): 10 min Charges:  OT General Charges $OT Visit: 1 Procedure OT Evaluation $Initial OT Evaluation Tier I: 1 Procedure OT Treatments $Self Care/Home Management : 8-22 mins G-Codes:    Boone MasterJones, Jacy Brocker B 07/11/2014, 1:14 PM Pager: [phone]

## 2014-07-11 NOTE — Progress Notes (Signed)
Orthopedic Tech Progress Note Patient Details:  Rico AlaVincent Pilkington 01/25/1951 [id number]  Patient ID: Rico AlaVincent Petko, male   DOB: 12/18/1950, 64 y.o.   MRN: [id number] Called in bio-tech brace order; spoke with Richardean Chimeraathy  Tyniah Kastens 07/11/2014, 9:30 AM

## 2014-07-11 NOTE — Clinical Social Work Note (Signed)
CSW acknowledged:  Clinical Child psychotherapistocial Worker received a consult for SNF placement. PT/OT currently recommending "No PT/OT follow up".   Clinical Social Worker will sign off for now as social work intervention is no longer needed. Please consult us again if new need arises.  Derenda FennelBashira Delia Sitar, MSW, LCSWA [phone] 07/11/2014 3:11 PM

## 2014-07-11 NOTE — Evaluation (Signed)
Physical Therapy Evaluation Patient Details Name: [name] MRN: [id number] DOB: 12/25/1950 Today's Date: 07/11/2014   History of Present Illness  64 yo male s/p L4-5 L5- s1 diskectomy with expandable cage, laminectomy L4-5 facetectomy and pedicle screws posterolateral arthrodeiss with autograft and BMP  Clinical Impression  Patient demonstrates deficits in functional mobility as indicated below. Will benefit from continued skilled PT to address deficits and maximize function. Will see as indicated and progress as tolerated.     Follow Up Recommendations No PT follow up    Equipment Recommendations  None recommended by PT    Recommendations for Other Services       Precautions / Restrictions Precautions Precautions: Back Precaution Comments: back handout provided and reviewed in detail Required Braces or Orthoses: Spinal Brace Spinal Brace: Lumbar corset;Applied in sitting position      Mobility  Bed Mobility Overal bed mobility: Needs Assistance Bed Mobility: Rolling;Sidelying to Sit Rolling: Supervision Sidelying to sit: Supervision       General bed mobility comments: VCs for sequencing and technique, increased time to perform  Transfers Overall transfer level: Modified independent Equipment used: Rolling walker (2 wheeled)             General transfer comment: able to perform with and without device  Ambulation/Gait Ambulation/Gait assistance: Supervision Ambulation Distance (Feet): 340 Feet Assistive device: Rolling walker (2 wheeled) (120' without device) Gait Pattern/deviations: Step-through pattern;Narrow base of support Gait velocity: decreased modestly Gait velocity interpretation: Below normal speed for age/gender    Stairs Stairs: Yes Stairs assistance: Supervision Stair Management: Two rails;Forwards;Alternating pattern Number of Stairs: 5 General stair comments: steady with no physical assist  Wheelchair Mobility    Modified  Rankin (Stroke Patients Only)       Balance Overall balance assessment: No apparent balance deficits (not formally assessed)                                           Pertinent Vitals/Pain Pain Assessment: Faces Faces Pain Scale: Hurts a little bit Pain Location: R LE Pain Descriptors / Indicators: Aching Pain Intervention(s): Monitored during session;Premedicated before session;Repositioned    Home Living Family/patient expects to be discharged to:: Private residence Living Arrangements: Alone Available Help at Discharge: Friend(s);Available PRN/intermittently Type of Home: House Home Access: Stairs to enter Entrance Stairs-Rails: None Entrance Stairs-Number of Steps: 2 Home Layout: One level Home Equipment: None Additional Comments: standard toilets, walk in shower    Prior Function Level of Independence: Independent with assistive device(s)         Comments: use cane      Hand Dominance   Dominant Hand: Right    Extremity/Trunk Assessment   Upper Extremity Assessment: Overall WFL for tasks assessed           Lower Extremity Assessment: Overall WFL for tasks assessed      Cervical / Trunk Assessment: Other exceptions (s/p surg)  Communication   Communication: HOH (wear glasses all the time)  Cognition Arousal/Alertness: Awake/alert Behavior During Therapy: WFL for tasks assessed/performed Overall Cognitive Status: Within Functional Limits for tasks assessed                      General Comments      Exercises        Assessment/Plan    PT Assessment Patient needs continued PT services  PT Diagnosis Abnormality of gait;Generalized  weakness;Acute pain   PT Problem List Decreased strength;Decreased range of motion;Decreased activity tolerance;Decreased mobility;Pain  PT Treatment Interventions DME instruction;Gait training;Stair training;Functional mobility training;Therapeutic activities;Therapeutic exercise;Balance  training;Patient/family education   PT Goals (Current goals can be found in the Care Plan section) Acute Rehab PT Goals Patient Stated Goal: to go home PT Goal Formulation: With patient Time For Goal Achievement: 07/25/14 Potential to Achieve Goals: Good    Frequency Min 5X/week   Barriers to discharge        Co-evaluation               End of Session Equipment Utilized During Treatment: Gait belt;Back brace Activity Tolerance: Patient tolerated treatment well Patient left: in chair;with call bell/phone within reach Nurse Communication: Mobility status         Time: [id number] PT Time Calculation (min) (ACUTE ONLY): 23 min   Charges:   PT Evaluation $Initial PT Evaluation Tier I: 1 Procedure     PT G CodesFabio Asa:        Aleshka Corney J 07/11/2014, 1:35 PM Charlotte Crumbevon Tomasita Beevers, PT DPT  [phone]

## 2014-07-11 NOTE — Progress Notes (Signed)
PT Cancellation Note  Patient Details Name: Rico AlaVincent Klinge MRN: [id number] DOB: 05/12/1950   Cancelled Treatment:    Reason Eval/Treat Not Completed: Other (comment) Awaiting brace   Fabio AsaWerner, Genevive Printup J 07/11/2014, 9:17 AM Charlotte Crumbevon Lizzeth Meder, PT DPT  [phone]

## 2014-07-11 NOTE — Progress Notes (Signed)
Patient ID: [name], male   DOB: 01/08/1951, 64 y.o.   MRN: [id number] Doing great, no weakness. Minimal pain. Ambulating. Still hemovac with drainage

## 2014-07-11 NOTE — Op Note (Signed)
[name], [name]          ACCOUNT NO.:  [id number]  MEDICAL RECORD NO.:  [id number]  LOCATION:  [id number]                        FACILITY:  [facility]  PHYSICIAN:  [name], M.D.   DATE OF BIRTH:  [birth year]  DATE OF PROCEDURE:  07/10/2014 DATE OF DISCHARGE:                              OPERATIVE REPORT   PREOPERATIVE DIAGNOSES:  L4-5, L5-S1 degenerative disk disease, spondylosis, chronic radiculopathy.  POSTOPERATIVE DIAGNOSES:  L4-5, L5-S1 degenerative disk disease, spondylosis, chronic radiculopathy.  PROCEDURES:  L4-5 bilateral diskectomy more than normal to introduce 2 expandable cages.  Laminectomy L4-L5, facetectomy, lysis of adhesions. Pedicle screws at L4, L5, S1.  Posterolateral arthrodesis with autograft and BMP, Cell Saver, C-arm.  SURGEON:  Jacob LiasErnesto [name], M.D.  ASSISTANT:  [name], M.D.  CLINICAL HISTORY:  The patient in the past had surgery by me in his neck with cervical fusion.  For a long while, he has had been complaining of back pain, which is getting worse with radiation to both legs associated with weakness.  X-ray showed degenerative disk disease, spondylosis, herniated disk at level 4-5, 5-1.  We talked about surgery.  At the end, we decided with fusion.  He knew the risk and benefits.  DESCRIPTION OF PROCEDURE:  The patient was taken to the OR, and after intubation, he was positioned in a prone manner.  The back was cleaned with Betadine and later on with DuraPrep.  Drapes were applied.  Midline incision from L3-4 down to L5-S1 was made and muscle were retracted Wang the way laterally until we were able to see the lateral aspect of the facet of 4-5 and the ala of the sacrum.  X-ray showed that indeed we were at right 4-5.  We started at the level 4-5.  We removed the spinous process of L4, the lamina, and the facet.  The patient has quite a bit of overgrowth of the facet.  At level 5-1, we did the same procedure with removal of the  spinous process of L5, the lamina of the facet.  We tried to enter in the disk space of 5-1, but the area was quite narrow. From then on, we went to the level L4-L5 where we opened the disk in the right side and later on in the left side.  Total gross diskectomy more than normal medial-laterally was achieved with removal of the almost the whole disk.  The endplates were drilled.  We introduced 2 cages, which were expanded to 17 mm with a 15-mm lordosis.  The cages had of BMP and autograft inside.  The rest of the disk space was filled up with autograft and BMP.  From then on, using the C-arm first in AP view and then in a lateral view, we made holes of the pedicle of L4, L5, and S1. We feel the L4 quadrant just to be sure that we were surrounded by bone. At the level L4-L5, we introduced 4 screws of 6.5 x 40 and at the level of S1, it was 6.5 x 50.  The screws were kept in place with rods and kept in place with Capps.  Cross-link from right to left was done. Then, we went laterally and we removed the periosteum  on the lateral aspect of the facet of 4-5 and the ala of the sacrum.  A mix of BMP and autograft was used for arthrodesis.  A cross-link from right to left was done.  A drain was left in the operative site.  Valsalva maneuver was negative.  Vancomycin was left in the operative site and the wound was closed with Vicryl and Steri-Strips.          ______________________________ [name], M.D.     EB/MEDQ  D:  07/10/2014  T:  07/11/2014  Job:  [id number]

## 2014-07-12 MED ORDER — BISACODYL 5 MG PO TBEC: 5.0000 mg | DELAYED_RELEASE_TABLET | Freq: Every day | ORAL | Status: DC | PRN

## 2014-07-12 NOTE — Progress Notes (Addendum)
Physical Therapy Treatment and discharge Patient Details Name: [name] MRN: [id number] DOB: 1950/09/29 Today's Date: 07/12/2014    History of Present Illness 64 yo male s/p L4-5 L5- s1 diskectomy with expandable cage, laminectomy L4-5 facetectomy and pedicle screws posterolateral arthrodeiss with autograft and BMP    PT Comments    Pt observed walking the hallways all AM by PT, multiple laps at a time.  Pt ambulating with no LOB and wearing lumbar corset.  Pt declined any further stair training, as he did them yesterday and feels comfortable.  Assessed bed mobility and donning brace.  Pt did report he had put on upside down before, so educated on cues for proper placement.  Educated on how to incorporate proper body mechanics as he continues to feel better. Pt has met goals/education complete and will d/c from PT services.  Follow Up Recommendations  No PT follow up     Equipment Recommendations  None recommended by PT    Recommendations for Other Services       Precautions / Restrictions Precautions Precautions: Back Precaution Booklet Issued: Yes (comment) Required Braces or Orthoses: Spinal Brace Spinal Brace: Lumbar corset;Applied in sitting position Restrictions Weight Bearing Restrictions: No    Mobility  Bed Mobility Overal bed mobility: Modified Independent Bed Mobility: Rolling;Sidelying to Sit Rolling: Modified independent (Device/Increase time) Sidelying to sit: Modified independent (Device/Increase time)       General bed mobility comments: Pt demonstrated good body mechanics with rolling and to sit to EOB.  Donned brace at EOB and reviewed its use with pt.  Transfers Overall transfer level: Modified independent Equipment used: None                Ambulation/Gait                 Stairs Stairs:  (Pt declined stairs- felt good about them yesterday)          Wheelchair Mobility    Modified Rankin (Stroke Patients Only)        Balance                                    Cognition Arousal/Alertness: Awake/alert Behavior During Therapy: WFL for tasks assessed/performed Overall Cognitive Status: Within Functional Limits for tasks assessed                      Exercises      General Comments General comments (skin integrity, edema, etc.): Pt with good recall of precautions, but educated on how to make sure he incorporates them into his mobility as he continues to feel better.      Pertinent Vitals/Pain Pain Assessment: Faces Faces Pain Scale: Hurts a little bit Pain Location: with log rolling    Home Living                      Prior Function            PT Goals (current goals can now be found in the care plan section) Acute Rehab PT Goals Patient Stated Goal: to go home PT Goal Formulation: With patient Time For Goal Achievement: 07/25/14 Potential to Achieve Goals: Good Progress towards PT goals: Goals met/education completed, patient discharged from PT    Frequency       PT Plan Current plan remains appropriate    Co-evaluation  End of Session Equipment Utilized During Treatment: Back brace Activity Tolerance: Patient tolerated treatment well Patient left: with nursing/sitter in room     Time: 0205-0215 PT Time Calculation (min) (ACUTE ONLY): 10 min  Charges:  $Therapeutic Activity: 8-22 mins                    G Codes:      Willard Farquharson LUBECK 07/12/2014, 3:19 PM

## 2014-07-12 NOTE — Progress Notes (Signed)
Hemovac removed per order. Incision site noted CDI. Hemovac site oozing blood. Pressure dressing applied.   Sim BoastHavy, RN

## 2014-07-12 NOTE — Progress Notes (Signed)
Patient ID: [name], male   DOB: 07/10/1950, 64 y.o.   MRN: [id number] Doing well, less drainege in hemovac. No weakness

## 2014-07-13 NOTE — Discharge Summary (Signed)
Physician Discharge Summary  Patient ID: [name] MRN: [id number] DOB/AGE: 64/08/1950 64 y.o.  Admit date: 07/10/2014 Discharge date: 07/13/2014  Admission Diagnoses:lumbar spondylolisthesis  Discharge Diagnoses:  Active Problems:   Spondylolisthesis of lumbar region   Discharged Condition:stable  Hospital Course: surgery  Consults: none  Significant Diagnostic Studies: mri  Treatments: lumbar fusion  Discharge Exam: Blood pressure 129/65, pulse 83, temperature 97.9 F (36.6 C), temperature source Oral, resp. rate 18, weight 96.163 kg (212 lb), SpO2 100 %. No pain, no weakness  Disposition: home to se me in 3 weeks     Medication List    ASK your doctor about these medications        aspirin 81 MG tablet  Take 81 mg by mouth daily.     atorvastatin 80 MG tablet  Commonly known as:  LIPITOR  Take 1 tablet (80 mg total) by mouth daily.     gabapentin 100 MG capsule  Commonly known as:  NEURONTIN  Take 200 mg by mouth 2 (two) times daily.     ibuprofen 800 MG tablet  Commonly known as:  ADVIL,MOTRIN  Take 800 mg by mouth daily.     metoprolol succinate 50 MG 24 hr tablet  Commonly known as:  TOPROL-XL  Take 1/2 tablet (25mg ) by mouth daily. Take with or immediately following a meal.     oxycodone 30 MG immediate release tablet  Commonly known as:  ROXICODONE  Take 1 tablet by mouth every 6 (six) hours as needed.     pantoprazole 40 MG tablet  Commonly known as:  PROTONIX  Take 1 tablet (40 mg total) by mouth daily.     tamsulosin 0.4 MG Caps capsule  Commonly known as:  FLOMAX  Take 0.4 mg by mouth daily.     valsartan 80 MG tablet  Commonly known as:  DIOVAN  Take 1 tablet (80 mg total) by mouth daily.         Signed: [name],[name] 07/13/2014, 8:54 AM

## 2014-07-13 NOTE — Progress Notes (Signed)
Pt discharge orders received.  Pt left the unit before nurse could give him his discharge packet. Sondra ComeSilva, Jaxxson Cavanah M, RN

## 2014-09-04 ENCOUNTER — Other Ambulatory Visit: Payer: Self-pay | Admitting: *Deleted

## 2014-09-04 MED ORDER — METOPROLOL SUCCINATE ER 50 MG PO TB24: ORAL_TABLET | ORAL | Status: DC

## 2014-09-04 MED ORDER — VALSARTAN 80 MG PO TABS: 80.0000 mg | ORAL_TABLET | Freq: Every day | ORAL | Status: DC

## 2014-09-04 MED ORDER — PANTOPRAZOLE SODIUM 40 MG PO TBEC: 40.0000 mg | DELAYED_RELEASE_TABLET | Freq: Every day | ORAL | Status: DC

## 2014-12-03 ENCOUNTER — Other Ambulatory Visit: Payer: Self-pay | Admitting: *Deleted

## 2014-12-03 MED ORDER — VALSARTAN 80 MG PO TABS: 80.0000 mg | ORAL_TABLET | Freq: Every day | ORAL | Status: DC

## 2015-04-01 DIAGNOSIS — G894 Chronic pain syndrome: Secondary | ICD-10-CM | POA: Diagnosis not present

## 2015-04-01 DIAGNOSIS — I10 Essential (primary) hypertension: Secondary | ICD-10-CM | POA: Diagnosis not present

## 2015-04-01 DIAGNOSIS — I208 Other forms of angina pectoris: Secondary | ICD-10-CM | POA: Diagnosis not present

## 2015-04-01 DIAGNOSIS — Z6836 Body mass index (BMI) 36.0-36.9, adult: Secondary | ICD-10-CM | POA: Diagnosis not present

## 2015-04-01 DIAGNOSIS — Z1389 Encounter for screening for other disorder: Secondary | ICD-10-CM | POA: Diagnosis not present

## 2015-04-29 ENCOUNTER — Other Ambulatory Visit: Payer: Self-pay | Admitting: *Deleted

## 2015-04-29 MED ORDER — METOPROLOL SUCCINATE ER 50 MG PO TB24: ORAL_TABLET | ORAL | Status: DC

## 2015-04-29 MED ORDER — VALSARTAN 80 MG PO TABS: 80.0000 mg | ORAL_TABLET | Freq: Every day | ORAL | Status: DC

## 2015-04-29 MED ORDER — PANTOPRAZOLE SODIUM 40 MG PO TBEC: 40.0000 mg | DELAYED_RELEASE_TABLET | Freq: Every day | ORAL | Status: DC

## 2015-04-29 MED ORDER — ATORVASTATIN CALCIUM 80 MG PO TABS: 80.0000 mg | ORAL_TABLET | Freq: Every day | ORAL | Status: DC

## 2015-05-13 ENCOUNTER — Ambulatory Visit (INDEPENDENT_AMBULATORY_CARE_PROVIDER_SITE_OTHER): Payer: PPO | Admitting: Cardiology

## 2015-05-13 ENCOUNTER — Encounter: Payer: Self-pay | Admitting: Cardiology

## 2015-05-13 ENCOUNTER — Encounter: Payer: Self-pay | Admitting: *Deleted

## 2015-05-13 VITALS — BP 130/86 | HR 57 | Ht 64.0 in | Wt 215.0 lb

## 2015-05-13 DIAGNOSIS — E785 Hyperlipidemia, unspecified: Secondary | ICD-10-CM

## 2015-05-13 DIAGNOSIS — I10 Essential (primary) hypertension: Secondary | ICD-10-CM | POA: Diagnosis not present

## 2015-05-13 DIAGNOSIS — I2510 Atherosclerotic heart disease of native coronary artery without angina pectoris: Secondary | ICD-10-CM | POA: Diagnosis not present

## 2015-05-13 NOTE — Patient Instructions (Signed)
Continue all current medications. Your physician wants you to follow up in:  1 year.  You will receive a reminder letter in the mail one-two months in advance.  If you don't receive a letter, please call our office to schedule the follow up appointment   

## 2015-05-13 NOTE — Progress Notes (Signed)
Patient ID: [name], male   DOB: 04/28/1950, 65 y.o.   MRN: [id number]     Clinical Summary Mr. Reaume is a 65 y.o.male seen today for follow up of the following medical problems.   1. CAD - hx of CABG 05/2011 (LIMA-LAD) - echo 05/2011 LVEF >55%, aortic sclerosis, mild MR,  - denies any chest pain. No SOB or DOE since last visit - last year completed back surgery, mobility is increasing and becoming more active though his back still limits him.   2. Hyperlipidemia - reports recent labs with pcp - compliant with statin  3. HTN - does not check regularly - he is compliant with meds  Past Medical History  Diagnosis Date  . Chest pain   . Coronary artery disease   . Hypertension   . Arthritis   . Smoker   . Angina at rest 05/28/2011  . Abnormal nuclear stress test 05/28/2011  . Tobacco abuse 05/28/2011  . Dyslipidemia 05/28/2011  . Arrhythmia     pt stated "it's ok cause no one is concerned about it"  . Depression      Allergies  Allergen Reactions  . Penicillins Other (See Comments)    unknown  . Ramipril Cough     Current Outpatient Prescriptions  Medication Sig Dispense Refill  . aspirin 81 MG tablet Take 81 mg by mouth daily.    Marland Kitchen atorvastatin (LIPITOR) 80 MG tablet Take 1 tablet (80 mg total) by mouth daily. 90 tablet 3  . gabapentin (NEURONTIN) 100 MG capsule Take 200 mg by mouth 2 (two) times daily.     Marland Kitchen ibuprofen (ADVIL,MOTRIN) 800 MG tablet Take 800 mg by mouth daily.    . metoprolol succinate (TOPROL-XL) 50 MG 24 hr tablet Take 1/2 tablet ( ) by mouth daily. Take with or immediately following a meal. 45 tablet 3  . oxycodone (ROXICODONE) 30 MG immediate release tablet Take 1 tablet by mouth every 6 (six) hours as needed.    . pantoprazole (PROTONIX) 40 MG tablet Take 1 tablet (40 mg total) by mouth daily. 90 tablet 3  . tamsulosin (FLOMAX) 0.4 MG CAPS capsule Take 0.4 mg by mouth daily.    . valsartan (DIOVAN) 80 MG tablet Take 1 tablet (80 mg  total) by mouth daily. 90 tablet 3   No current facility-administered medications for this visit.     Past Surgical History  Procedure Laterality Date  . Knee arthroscopy w/ debridement    . Cervical disc surgery    . Coronary artery bypass graft  05/27/2011    Procedure: OFF PUMP CORONARY ARTERY BYPASS GRAFTING (CABG);  Surgeon: Loreli Slot, MD;  Location: [facility] OR;  Service: Open Heart Surgery;  Laterality: N/A;  . Left heart catheterization with coronary angiogram N/A 05/26/2011    Procedure: LEFT HEART CATHETERIZATION WITH CORONARY ANGIOGRAM;  Surgeon: Chrystie Nose, MD;  Location: [facility] CATH LAB;  Service: Cardiovascular;  Laterality: N/A;  . Cardiac catheterization       Allergies  Allergen Reactions  . Penicillins Other (See Comments)    unknown  . Ramipril Cough      Family History  Problem Relation Age of Onset  . Valvular heart disease  70    congenital/had sugery for mechanical valve installed  . Stroke Father   . Hypertension Mother   . Kidney disease Mother   . Heart disease Brother   . Kidney disease Brother     transplant     Social History Mr.  Saputo reports that he quit smoking about 12 months ago. His smoking use included Cigarettes. He started smoking about 43 years ago. He has a 35 pack-year smoking history. He has never used smokeless tobacco. Mr. Merryfield reports that he does not drink alcohol.   Review of Systems CONSTITUTIONAL: No weight loss, fever, chills, weakness or fatigue.  HEENT: Eyes: No visual loss, blurred vision, double vision or yellow sclerae.No hearing loss, sneezing, congestion, runny nose or sore throat.  SKIN: No rash or itching.  CARDIOVASCULAR: per HPI RESPIRATORY: No shortness of breath, cough or sputum.  GASTROINTESTINAL: No anorexia, nausea, vomiting or diarrhea. No abdominal pain or blood.  GENITOURINARY: No burning on urination, no polyuria NEUROLOGICAL: No headache, dizziness, syncope, paralysis, ataxia,  numbness or tingling in the extremities. No change in bowel or bladder control.  MUSCULOSKELETAL: No muscle, back pain, joint pain or stiffness.  LYMPHATICS: No enlarged nodes. No history of splenectomy.  PSYCHIATRIC: No history of depression or anxiety.  ENDOCRINOLOGIC: No reports of sweating, cold or heat intolerance. No polyuria or polydipsia.  Marland Kitchen   Physical Examination Filed Vitals:   05/13/15 0818  BP: 130/86  Pulse: 57   Filed Vitals:   05/13/15 0818  Height:  (1.626 m)  Weight: 215 lb (97.523 kg)    Gen: resting comfortably, no acute distress HEENT: no scleral icterus, pupils equal round and reactive, no palptable cervical adenopathy,  CV: RRR, 2/6 systolic murmur RUSB, no jvd Resp: Clear to auscultation bilaterally GI: abdomen is soft, non-tender, non-distended, normal bowel sounds, no hepatosplenomegaly MSK: extremities are warm, no edema.  Skin: warm, no rash Neuro:  no focal deficits Psych: appropriate affect   Diagnostic Studies 05/2011 Cath Hemodynamics: Central Aortic Pressure / Mean Aortic Pressure: 120/76 LV Pressure / LV End diastolic Pressure: 20  Left Ventriculography: EF: 60-65% Wall Motion: No focal wall motion abnormalities  Coronary Angiographic Data:  Left Main: Short left main. Distal 20% stenosis into the ostial LAD.  Left Anterior Descending (LAD): There is a 90% ostial discrete lesion of the LAD. There is mild distal LAD disease.  1st diagonal (D1): Large vessel without significant stenosis.  Circumflex (LCx): Dominant vessel. There is a moderate stenosis of the LCX up to 50% distal to OM3.  1st obtuse marginal: Small vessel, high takeoff. No significant disease.  2nd obtuse marginal: Small vessel, no significant disease 3rd obtuse marginal: Large vessel which branches into 2 vessels supplying a good portion of the lateral wall. No significant stenoses.  posterior  lateral Amairany Schumpert: Supplies the septum.  Right Coronary Artery: Small to moderate-sized non-dominant vessel, no significant stenoses.  Impression: 1. High risk ostial LAD discrete stenosis (90%). 2. Moderate mid-distal LCx stenosis. Left dominant system. 3. LVEF >60%, no focal wall motion abnormalities.  Plan: 1. Discussed case with Dr. Allyson Sabal and Dr. Alanda Amass. Stent would be high risk. Will ask CT surgery to evaluate for possible OPCABG. 2. Will admit due to high risk anatomy with hopefully surgical intervention in the next few days. He has not been on dual antiplatelets.  The case and results was discussed with the patient and family. The case and results was not discussed with the patient's PCP. The case and results was discussed with the patient's Cardiologist.    3/03/05/15 Clinic EKG (performed and reviewed in clinic) NSR, no ischemic changes    Assessment and Plan  1. CAD - no current symptoms - continue current meds  2. HTN - at goal, we will continue current meds  3.  Hyperlipidemia - request labs from pcp - continue high dose statin in setting of known CAD    F/u 1 year   Antoine PocheJonathan F. Henning Ehle, M.D.

## 2015-06-27 DIAGNOSIS — I2510 Atherosclerotic heart disease of native coronary artery without angina pectoris: Secondary | ICD-10-CM | POA: Diagnosis not present

## 2015-06-27 DIAGNOSIS — Z6837 Body mass index (BMI) 37.0-37.9, adult: Secondary | ICD-10-CM | POA: Diagnosis not present

## 2015-06-27 DIAGNOSIS — I10 Essential (primary) hypertension: Secondary | ICD-10-CM | POA: Diagnosis not present

## 2015-06-27 DIAGNOSIS — E782 Mixed hyperlipidemia: Secondary | ICD-10-CM | POA: Diagnosis not present

## 2015-07-16 DIAGNOSIS — R609 Edema, unspecified: Secondary | ICD-10-CM | POA: Diagnosis not present

## 2015-07-16 DIAGNOSIS — Z6837 Body mass index (BMI) 37.0-37.9, adult: Secondary | ICD-10-CM | POA: Diagnosis not present

## 2015-07-16 DIAGNOSIS — M1991 Primary osteoarthritis, unspecified site: Secondary | ICD-10-CM | POA: Diagnosis not present

## 2015-07-16 DIAGNOSIS — Z1389 Encounter for screening for other disorder: Secondary | ICD-10-CM | POA: Diagnosis not present

## 2015-07-16 DIAGNOSIS — G894 Chronic pain syndrome: Secondary | ICD-10-CM | POA: Diagnosis not present

## 2015-07-30 IMAGING — DX DG LUMBAR SPINE 1V
1 series · 1 of 1 positions shown · non-contrast
Comparison: MRI 06/20/2014.

CLINICAL DATA: Back surgery.

EXAM:
LUMBAR SPINE - 1 VIEW

[lat]
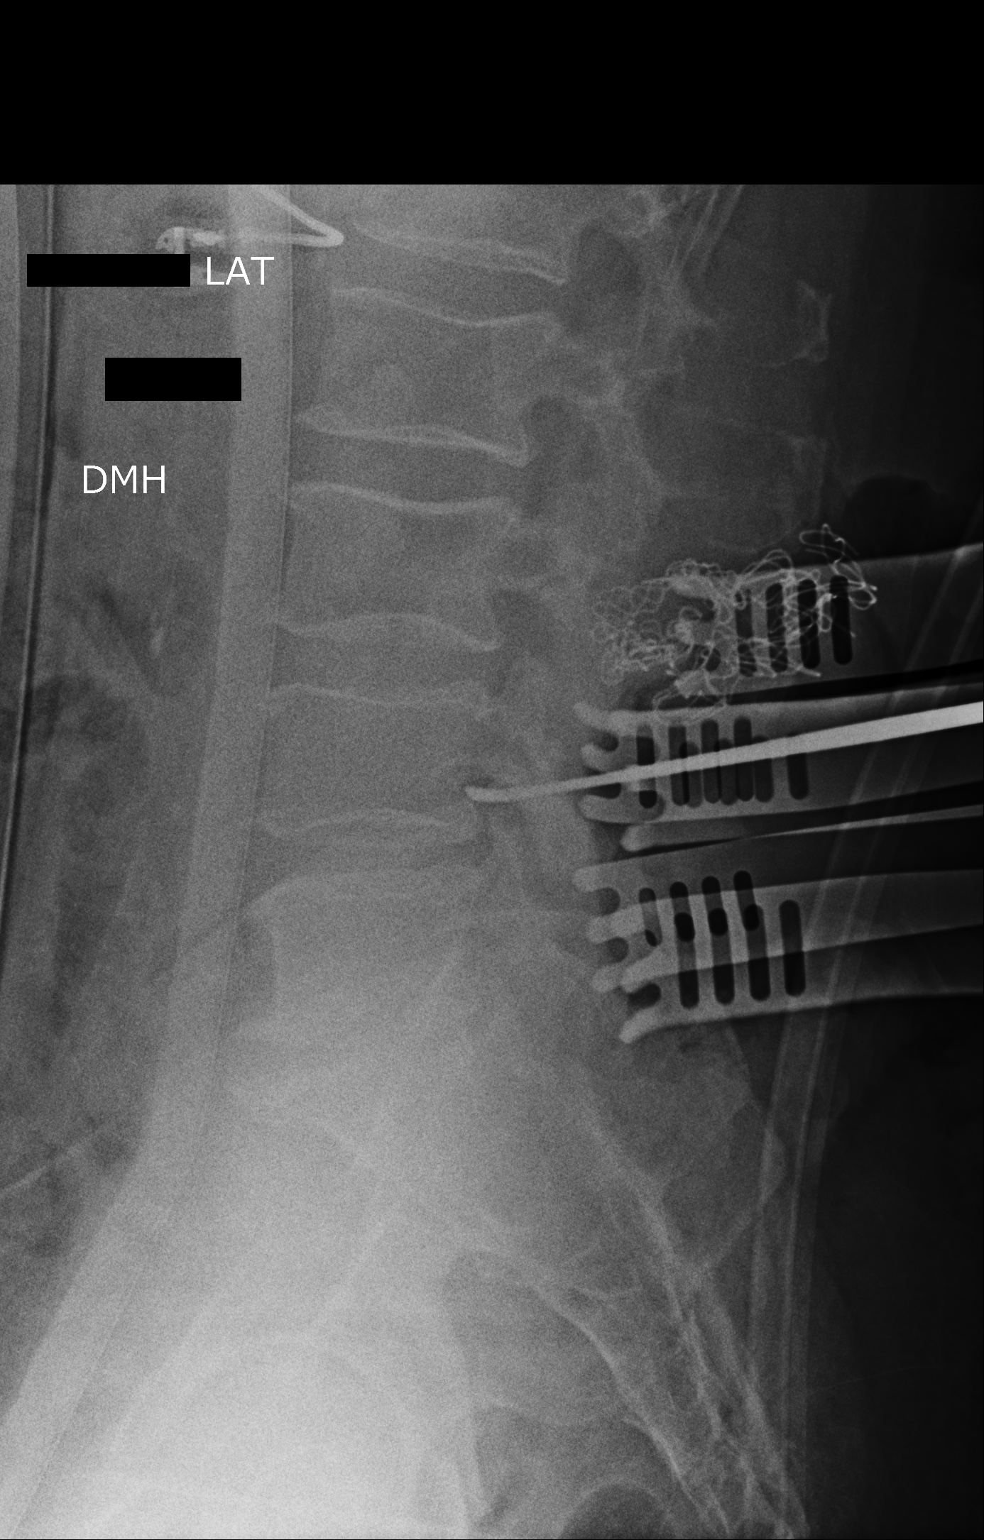

[1 of 1 positions shown; findings below may reference images not displayed]

FINDINGS: Lumbar spine numbered as per prior MRI. Metallic probe noted along
the inferior aspect of L4 posteriorly.
IMPRESSION: Metallic marker noted along the inferior aspect of L4 posteriorly.

## 2015-09-26 DIAGNOSIS — Z6837 Body mass index (BMI) 37.0-37.9, adult: Secondary | ICD-10-CM | POA: Diagnosis not present

## 2015-09-26 DIAGNOSIS — I10 Essential (primary) hypertension: Secondary | ICD-10-CM | POA: Diagnosis not present

## 2015-09-26 DIAGNOSIS — G894 Chronic pain syndrome: Secondary | ICD-10-CM | POA: Diagnosis not present

## 2015-09-26 DIAGNOSIS — F419 Anxiety disorder, unspecified: Secondary | ICD-10-CM | POA: Diagnosis not present

## 2015-12-30 DIAGNOSIS — M1991 Primary osteoarthritis, unspecified site: Secondary | ICD-10-CM | POA: Diagnosis not present

## 2015-12-30 DIAGNOSIS — I208 Other forms of angina pectoris: Secondary | ICD-10-CM | POA: Diagnosis not present

## 2015-12-30 DIAGNOSIS — I10 Essential (primary) hypertension: Secondary | ICD-10-CM | POA: Diagnosis not present

## 2015-12-30 DIAGNOSIS — Z6839 Body mass index (BMI) 39.0-39.9, adult: Secondary | ICD-10-CM | POA: Diagnosis not present

## 2015-12-30 DIAGNOSIS — E669 Obesity, unspecified: Secondary | ICD-10-CM | POA: Diagnosis not present

## 2015-12-30 DIAGNOSIS — G894 Chronic pain syndrome: Secondary | ICD-10-CM | POA: Diagnosis not present

## 2016-01-09 DIAGNOSIS — Z1211 Encounter for screening for malignant neoplasm of colon: Secondary | ICD-10-CM | POA: Diagnosis not present

## 2016-03-02 HISTORY — PX: OTHER SURGICAL HISTORY: SHX169

## 2016-04-01 DIAGNOSIS — E063 Autoimmune thyroiditis: Secondary | ICD-10-CM | POA: Diagnosis not present

## 2016-04-01 DIAGNOSIS — N400 Enlarged prostate without lower urinary tract symptoms: Secondary | ICD-10-CM | POA: Diagnosis not present

## 2016-04-01 DIAGNOSIS — Z6839 Body mass index (BMI) 39.0-39.9, adult: Secondary | ICD-10-CM | POA: Diagnosis not present

## 2016-04-01 DIAGNOSIS — I10 Essential (primary) hypertension: Secondary | ICD-10-CM | POA: Diagnosis not present

## 2016-04-01 DIAGNOSIS — Z1389 Encounter for screening for other disorder: Secondary | ICD-10-CM | POA: Diagnosis not present

## 2016-05-04 ENCOUNTER — Other Ambulatory Visit: Payer: Self-pay | Admitting: *Deleted

## 2016-05-04 MED ORDER — VALSARTAN 80 MG PO TABS: 80.0000 mg | ORAL_TABLET | Freq: Every day | ORAL | 3 refills | Status: DC

## 2016-05-04 MED ORDER — PANTOPRAZOLE SODIUM 40 MG PO TBEC: 40.0000 mg | DELAYED_RELEASE_TABLET | Freq: Every day | ORAL | 3 refills | Status: DC

## 2016-05-04 MED ORDER — METOPROLOL SUCCINATE ER 50 MG PO TB24: ORAL_TABLET | ORAL | 3 refills | Status: DC

## 2016-05-04 MED ORDER — ATORVASTATIN CALCIUM 80 MG PO TABS: 80.0000 mg | ORAL_TABLET | Freq: Every day | ORAL | 3 refills | Status: DC

## 2016-05-21 ENCOUNTER — Encounter: Payer: Self-pay | Admitting: *Deleted

## 2016-05-22 ENCOUNTER — Telehealth: Payer: Self-pay | Admitting: Cardiology

## 2016-05-22 ENCOUNTER — Encounter: Payer: Self-pay | Admitting: *Deleted

## 2016-05-22 ENCOUNTER — Encounter: Payer: Self-pay | Admitting: Cardiology

## 2016-05-22 ENCOUNTER — Ambulatory Visit (INDEPENDENT_AMBULATORY_CARE_PROVIDER_SITE_OTHER): Payer: PPO | Admitting: Cardiology

## 2016-05-22 VITALS — BP 115/68 | HR 80 | Ht 64.0 in | Wt 220.8 lb

## 2016-05-22 DIAGNOSIS — Z136 Encounter for screening for cardiovascular disorders: Secondary | ICD-10-CM

## 2016-05-22 DIAGNOSIS — R0989 Other specified symptoms and signs involving the circulatory and respiratory systems: Secondary | ICD-10-CM

## 2016-05-22 DIAGNOSIS — I10 Essential (primary) hypertension: Secondary | ICD-10-CM

## 2016-05-22 DIAGNOSIS — I2510 Atherosclerotic heart disease of native coronary artery without angina pectoris: Secondary | ICD-10-CM | POA: Diagnosis not present

## 2016-05-22 NOTE — Patient Instructions (Addendum)
Your physician wants you to follow-up in: 1 YEAR WITH DR. BRANCH You will receive a reminder letter in the mail two months in advance. If you don't receive a letter, please call our office to schedule the follow-up appointment.  Your physician recommends that you continue on your current medications as directed. Please refer to the Current Medication list given to you today.  Your physician has requested that you have a carotid duplex. This test is an ultrasound of the carotid arteries in your neck. It looks at blood flow through these arteries that supply the brain with blood. Allow one hour for this exam. There are no restrictions or special instructions.  Your physician has requested that you have an abdominal aorta duplex. During this test, an ultrasound is used to evaluate the aorta. Allow 30 minutes for this exam. Do not eat after midnight the day before and avoid carbonated beverages  Thank you for choosing  [facility]!!

## 2016-05-22 NOTE — Progress Notes (Signed)
Clinical Summary [name] is a 66 y.o.male seen today for follow up of the following medical problems.   1.CAD - hx of CABG 05/2011 (LIMA-LAD) - echo 05/2011 LVEF >55%, aortic sclerosis, mild MR,  - denies any chest pain. No SOB or DOE since last visit  - no recent chest pain. No SOB or DOE - compliant with med   2. Hyperlipidemia - reports recent labs with pcp - he is compliant with statin   3. HTN - does not check regularly at home - he is compliant with meds Past Medical History:  Diagnosis Date  . Abnormal nuclear stress test 05/28/2011  . Angina at rest [facility](H[facility]) 05/28/2011  . Arrhythmia    pt stated "it's ok cause no one is concerned about it"  . Arthritis   . Chest pain   . Coronary artery disease   . Depression   . Dyslipidemia 05/28/2011  . Hypertension   . Smoker   . Tobacco abuse 05/28/2011     Allergies  Allergen Reactions  . Penicillins Other (See Comments)    unknown  . Ramipril Cough     Current Outpatient Prescriptions  Medication Sig Dispense Refill  . aspirin 81 MG tablet Take 81 mg by mouth daily.    Marland Kitchen. atorvastatin (LIPITOR) 80 MG tablet Take 1 tablet (80 mg total) by mouth daily. 90 tablet 3  . ibuprofen (ADVIL,MOTRIN) 800 MG tablet Take 800 mg by mouth daily.    . metoprolol succinate (TOPROL-XL) 50 MG 24 hr tablet Take 1/2 tablet (25mg ) by mouth daily. Take with or immediately following a meal. 45 tablet 3  . Multiple Vitamin (MULTIVITAMIN) tablet Take 1 tablet by mouth daily.    Marland Kitchen. oxycodone (ROXICODONE) 30 MG immediate release tablet Take 1 tablet by mouth every 6 (six) hours as needed.    . pantoprazole (PROTONIX) 40 MG tablet Take 1 tablet (40 mg total) by mouth daily. 90 tablet 3  . tamsulosin (FLOMAX) 0.4 MG CAPS capsule Take 0.4 mg by mouth daily.    . valsartan (DIOVAN) 80 MG tablet Take 1 tablet (80 mg total) by mouth daily. 90 tablet 3   No current facility-administered medications for this visit.      Past Surgical  History:  Procedure Laterality Date  . CARDIAC CATHETERIZATION    . CERVICAL DISC SURGERY    . CORONARY ARTERY BYPASS GRAFT  05/27/2011   Procedure: OFF PUMP CORONARY ARTERY BYPASS GRAFTING (CABG);  Surgeon: Loreli SlotSteven C Hendrickson, MD;  [facility];  Service: Open Heart Surgery;  Laterality: N/A;  . KNEE ARTHROSCOPY W/ DEBRIDEMENT    . LEFT HEART CATHETERIZATION WITH CORONARY ANGIOGRAM N/A 05/26/2011   Procedure: LEFT HEART CATHETERIZATION WITH CORONARY ANGIOGRAM;  Surgeon: Chrystie NoseKenneth C. Hilty, MD;  [facility];  Service: Cardiovascular;  Laterality: N/A;     Allergies  Allergen Reactions  . Penicillins Other (See Comments)    unknown  . Ramipril Cough      Family History  Problem Relation Age of Onset  . Stroke Father   . Hypertension Mother   . Kidney disease Mother   . Heart disease Brother   . Kidney disease Brother     transplant  . Valvular heart disease  70    congenital/had sugery for mechanical valve installed     Social History [name] reports that he has been smoking Cigarettes.  He started smoking about 44 years ago. He has a 35.00 pack-year smoking history. He  has never used smokeless tobacco. [name] reports that he does not drink alcohol.   Review of Systems CONSTITUTIONAL: No weight loss, fever, chills, weakness or fatigue.  HEENT: Eyes: No visual loss, blurred vision, double vision or yellow sclerae.No hearing loss, sneezing, congestion, runny nose or sore throat.  SKIN: No rash or itching.  CARDIOVASCULAR: per hpi RESPIRATORY: No shortness of breath, cough or sputum.  GASTROINTESTINAL: No anorexia, nausea, vomiting or diarrhea. No abdominal pain or blood.  GENITOURINARY: No burning on urination, no polyuria NEUROLOGICAL: No headache, dizziness, syncope, paralysis, ataxia, numbness or tingling in the extremities. No change in bowel or bladder control.  MUSCULOSKELETAL: No muscle, back pain, joint pain or stiffness.  LYMPHATICS: No  enlarged nodes. No history of splenectomy.  PSYCHIATRIC: No history of depression or anxiety.  ENDOCRINOLOGIC: No reports of sweating, cold or heat intolerance. No polyuria or polydipsia.  Marland Kitchen   Physical Examination Vitals:   05/22/16 0846  BP: 115/68  Pulse: 80   Vitals:   05/22/16 0846  Weight: 220 lb 12.8 oz (100.2 kg)  Height: 5\' 4"  (1.626 m)    Gen: resting comfortably, no acute distress HEENT: no scleral icterus, pupils equal round and reactive, no palptable cervical adenopathy,  CV: RRR, no m/r/g, no jv. Carotid bruits bilaterally Resp: Clear to auscultation bilaterally GI: abdomen is soft, non-tender, non-distended, normal bowel sounds, no hepatosplenomegaly MSK: extremities are warm, no edema.  Skin: warm, no rash Neuro:  no focal deficits Psych: appropriate affect   Diagnostic Studies 05/2011 Cath Hemodynamics: Central Aortic Pressure / Mean Aortic Pressure: 120/76 LV Pressure / LV End diastolic Pressure: 20  Left Ventriculography: EF: 60-65% Wall Motion: No focal wall motion abnormalities  Coronary Angiographic Data:  Left Main: Short left main. Distal 20% stenosis into the ostial LAD.  Left Anterior Descending (LAD): There is a 90% ostial discrete lesion of the LAD. There is mild distal LAD disease.  1st diagonal (D1): Large vessel without significant stenosis.  Circumflex (LCx): Dominant vessel. There is a moderate stenosis of the LCX up to 50% distal to OM3.  1st obtuse marginal: Small vessel, high takeoff. No significant disease.  2nd obtuse marginal: Small vessel, no significant disease 3rd obtuse marginal: Large vessel which branches into 2 vessels supplying a good portion of the lateral wall. No significant stenoses.  posterior lateral Derwood Becraft: Supplies the septum.  Right Coronary Artery: Small to moderate-sized non-dominant vessel, no significant stenoses.  Impression: 1. High  risk ostial LAD discrete stenosis (90%). 2. Moderate mid-distal LCx stenosis. Left dominant system. 3. LVEF >60%, no focal wall motion abnormalities.  Plan: 1. Discussed case with Dr. Allyson Sabal and Dr. Alanda Amass. Stent would be high risk. Will ask CT surgery to evaluate for possible OPCABG. 2. Will admit due to high risk anatomy with hopefully surgical intervention in the next few days. He has not been on dual antiplatelets.  The case and results was discussed with the patient and family. The case and results was not discussed with the patient's PCP. The case and results was discussed with the patient's Cardiologist.    Assessment and Plan  1. CAD - he denies any recent  - continue current meds  2. HTN - he is at goal,  continue current meds  3. Hyperlipidemia - he will continue statin  4. Carotid bruits - obtain carotid US   5. AAA screen - male over 39 with tobacco history - order AAA screening US  F/u 1 year  Arnoldo Lenis, M.D., F.A.C.C.

## 2016-05-22 NOTE — Telephone Encounter (Signed)
Precert [place] AAA & Carotid April [place]th in [place]

## 2016-06-18 ENCOUNTER — Ambulatory Visit: Payer: PPO

## 2016-06-18 DIAGNOSIS — R0989 Other specified symptoms and signs involving the circulatory and respiratory systems: Secondary | ICD-10-CM | POA: Diagnosis not present

## 2016-06-18 DIAGNOSIS — Z136 Encounter for screening for cardiovascular disorders: Secondary | ICD-10-CM

## 2016-06-18 LAB — VAS US CAROTID
LEFT ECA DIAS: -2 cm/s
LEFT VERTEBRAL DIAS: -20 cm/s
Left CCA dist dias: -35 cm/s
Left CCA dist sys: -124 cm/s
Left CCA prox dias: 28 cm/s
Left CCA prox sys: 164 cm/s
Left ICA dist dias: -33 cm/s
Left ICA dist sys: -103 cm/s
Left ICA prox dias: -24 cm/s
Left ICA prox sys: -83 cm/s
RIGHT ECA DIAS: -17 cm/s
RIGHT VERTEBRAL DIAS: -18 cm/s
Right CCA prox dias: 20 cm/s
Right CCA prox sys: 149 cm/s
Right cca dist sys: -107 cm/s

## 2016-06-23 ENCOUNTER — Telehealth: Payer: Self-pay | Admitting: *Deleted

## 2016-06-23 NOTE — Telephone Encounter (Signed)
Pt aware - routed to pcp  

## 2016-06-23 NOTE — Telephone Encounter (Signed)
-----   Message from Antoine Poche, MD sent at 06/22/2016 10:13 AM EDT ----- Moderate bilateral blockages, we will continue to monitor  J BrancH MD

## 2016-06-26 DIAGNOSIS — Z6837 Body mass index (BMI) 37.0-37.9, adult: Secondary | ICD-10-CM | POA: Diagnosis not present

## 2016-06-26 DIAGNOSIS — I208 Other forms of angina pectoris: Secondary | ICD-10-CM | POA: Diagnosis not present

## 2016-06-26 DIAGNOSIS — I10 Essential (primary) hypertension: Secondary | ICD-10-CM | POA: Diagnosis not present

## 2016-06-26 DIAGNOSIS — Z23 Encounter for immunization: Secondary | ICD-10-CM | POA: Diagnosis not present

## 2016-06-26 DIAGNOSIS — G894 Chronic pain syndrome: Secondary | ICD-10-CM | POA: Diagnosis not present

## 2016-09-15 ENCOUNTER — Telehealth: Payer: Self-pay | Admitting: *Deleted

## 2016-09-15 MED ORDER — LOSARTAN POTASSIUM 50 MG PO TABS: 50.0000 mg | ORAL_TABLET | Freq: Every day | ORAL | 3 refills | Status: DC

## 2016-09-15 NOTE — Telephone Encounter (Signed)
Patient concerned about a commercial he saw today with a warning from the FDA about valsartan being carcinogenic. Please advise.

## 2016-09-15 NOTE — Telephone Encounter (Signed)
Only some of the valsartan pills from certain companies are being recalled due to a contaminant. It is not the valsartan itself that is the issue but the contaminant. He needs to touch base with his pharmacy to see if the valsartan he has been receiving has been recalled   Dina RichJonathan Jaena Brocato MD

## 2016-09-15 NOTE — Telephone Encounter (Signed)
[facility], pt wants mail order afterwords

## 2016-09-15 NOTE — Telephone Encounter (Signed)
Will notify Dr Wyline MoodBranch for dispo

## 2016-09-15 NOTE — Addendum Note (Signed)
Addended by: Marlyn CorporalARLTON, CATHERINE A on: 09/15/2016 02:20 PM   Modules accepted: Orders

## 2016-09-15 NOTE — Telephone Encounter (Signed)
Stop valsartan, start losartan 50mg  daily   Dominga FerryJ Chaitra Mast MD

## 2016-09-15 NOTE — Telephone Encounter (Signed)
I spoke with Lorin PicketScott at [facility] and the pt's valsartan is the brand recalled

## 2016-09-29 DIAGNOSIS — I10 Essential (primary) hypertension: Secondary | ICD-10-CM | POA: Diagnosis not present

## 2016-09-29 DIAGNOSIS — Z1389 Encounter for screening for other disorder: Secondary | ICD-10-CM | POA: Diagnosis not present

## 2016-09-29 DIAGNOSIS — Z6836 Body mass index (BMI) 36.0-36.9, adult: Secondary | ICD-10-CM | POA: Diagnosis not present

## 2016-09-29 DIAGNOSIS — G894 Chronic pain syndrome: Secondary | ICD-10-CM | POA: Diagnosis not present

## 2016-09-29 DIAGNOSIS — M75101 Unspecified rotator cuff tear or rupture of right shoulder, not specified as traumatic: Secondary | ICD-10-CM | POA: Diagnosis not present

## 2016-09-29 DIAGNOSIS — F419 Anxiety disorder, unspecified: Secondary | ICD-10-CM | POA: Diagnosis not present

## 2016-09-29 DIAGNOSIS — E669 Obesity, unspecified: Secondary | ICD-10-CM | POA: Diagnosis not present

## 2016-10-09 DIAGNOSIS — M25511 Pain in right shoulder: Secondary | ICD-10-CM | POA: Diagnosis not present

## 2016-10-14 DIAGNOSIS — M25511 Pain in right shoulder: Secondary | ICD-10-CM | POA: Diagnosis not present

## 2016-10-14 DIAGNOSIS — M75101 Unspecified rotator cuff tear or rupture of right shoulder, not specified as traumatic: Secondary | ICD-10-CM | POA: Diagnosis not present

## 2016-10-14 DIAGNOSIS — M62511 Muscle wasting and atrophy, not elsewhere classified, right shoulder: Secondary | ICD-10-CM | POA: Diagnosis not present

## 2016-10-16 DIAGNOSIS — M25511 Pain in right shoulder: Secondary | ICD-10-CM | POA: Diagnosis not present

## 2016-11-18 DIAGNOSIS — M75121 Complete rotator cuff tear or rupture of right shoulder, not specified as traumatic: Secondary | ICD-10-CM | POA: Diagnosis not present

## 2016-11-18 DIAGNOSIS — G8918 Other acute postprocedural pain: Secondary | ICD-10-CM | POA: Diagnosis not present

## 2016-11-18 DIAGNOSIS — M7551 Bursitis of right shoulder: Secondary | ICD-10-CM | POA: Diagnosis not present

## 2016-11-18 DIAGNOSIS — M75111 Incomplete rotator cuff tear or rupture of right shoulder, not specified as traumatic: Secondary | ICD-10-CM | POA: Diagnosis not present

## 2016-11-18 DIAGNOSIS — M19011 Primary osteoarthritis, right shoulder: Secondary | ICD-10-CM | POA: Diagnosis not present

## 2016-11-18 DIAGNOSIS — M7521 Bicipital tendinitis, right shoulder: Secondary | ICD-10-CM | POA: Diagnosis not present

## 2016-11-18 DIAGNOSIS — M24111 Other articular cartilage disorders, right shoulder: Secondary | ICD-10-CM | POA: Diagnosis not present

## 2016-11-27 DIAGNOSIS — M25511 Pain in right shoulder: Secondary | ICD-10-CM | POA: Diagnosis not present

## 2016-12-07 DIAGNOSIS — M75121 Complete rotator cuff tear or rupture of right shoulder, not specified as traumatic: Secondary | ICD-10-CM | POA: Diagnosis not present

## 2016-12-09 DIAGNOSIS — M75121 Complete rotator cuff tear or rupture of right shoulder, not specified as traumatic: Secondary | ICD-10-CM | POA: Diagnosis not present

## 2016-12-14 DIAGNOSIS — M75121 Complete rotator cuff tear or rupture of right shoulder, not specified as traumatic: Secondary | ICD-10-CM | POA: Diagnosis not present

## 2016-12-16 DIAGNOSIS — M75121 Complete rotator cuff tear or rupture of right shoulder, not specified as traumatic: Secondary | ICD-10-CM | POA: Diagnosis not present

## 2016-12-21 DIAGNOSIS — M75121 Complete rotator cuff tear or rupture of right shoulder, not specified as traumatic: Secondary | ICD-10-CM | POA: Diagnosis not present

## 2016-12-23 DIAGNOSIS — M75121 Complete rotator cuff tear or rupture of right shoulder, not specified as traumatic: Secondary | ICD-10-CM | POA: Diagnosis not present

## 2016-12-23 DIAGNOSIS — I10 Essential (primary) hypertension: Secondary | ICD-10-CM | POA: Diagnosis not present

## 2016-12-23 DIAGNOSIS — M1991 Primary osteoarthritis, unspecified site: Secondary | ICD-10-CM | POA: Diagnosis not present

## 2016-12-23 DIAGNOSIS — Z1389 Encounter for screening for other disorder: Secondary | ICD-10-CM | POA: Diagnosis not present

## 2016-12-23 DIAGNOSIS — I2510 Atherosclerotic heart disease of native coronary artery without angina pectoris: Secondary | ICD-10-CM | POA: Diagnosis not present

## 2016-12-23 DIAGNOSIS — F419 Anxiety disorder, unspecified: Secondary | ICD-10-CM | POA: Diagnosis not present

## 2016-12-23 DIAGNOSIS — E063 Autoimmune thyroiditis: Secondary | ICD-10-CM | POA: Diagnosis not present

## 2016-12-23 DIAGNOSIS — Z6836 Body mass index (BMI) 36.0-36.9, adult: Secondary | ICD-10-CM | POA: Diagnosis not present

## 2016-12-25 DIAGNOSIS — M25511 Pain in right shoulder: Secondary | ICD-10-CM | POA: Diagnosis not present

## 2016-12-28 DIAGNOSIS — M75121 Complete rotator cuff tear or rupture of right shoulder, not specified as traumatic: Secondary | ICD-10-CM | POA: Diagnosis not present

## 2016-12-30 DIAGNOSIS — M75121 Complete rotator cuff tear or rupture of right shoulder, not specified as traumatic: Secondary | ICD-10-CM | POA: Diagnosis not present

## 2017-01-04 DIAGNOSIS — M75121 Complete rotator cuff tear or rupture of right shoulder, not specified as traumatic: Secondary | ICD-10-CM | POA: Diagnosis not present

## 2017-01-06 DIAGNOSIS — M75121 Complete rotator cuff tear or rupture of right shoulder, not specified as traumatic: Secondary | ICD-10-CM | POA: Diagnosis not present

## 2017-01-11 DIAGNOSIS — M75121 Complete rotator cuff tear or rupture of right shoulder, not specified as traumatic: Secondary | ICD-10-CM | POA: Diagnosis not present

## 2017-01-13 DIAGNOSIS — M75121 Complete rotator cuff tear or rupture of right shoulder, not specified as traumatic: Secondary | ICD-10-CM | POA: Diagnosis not present

## 2017-01-18 DIAGNOSIS — M75121 Complete rotator cuff tear or rupture of right shoulder, not specified as traumatic: Secondary | ICD-10-CM | POA: Diagnosis not present

## 2017-01-20 DIAGNOSIS — M75121 Complete rotator cuff tear or rupture of right shoulder, not specified as traumatic: Secondary | ICD-10-CM | POA: Diagnosis not present

## 2017-01-25 DIAGNOSIS — M75121 Complete rotator cuff tear or rupture of right shoulder, not specified as traumatic: Secondary | ICD-10-CM | POA: Diagnosis not present

## 2017-01-27 DIAGNOSIS — M75121 Complete rotator cuff tear or rupture of right shoulder, not specified as traumatic: Secondary | ICD-10-CM | POA: Diagnosis not present

## 2017-02-01 DIAGNOSIS — M75121 Complete rotator cuff tear or rupture of right shoulder, not specified as traumatic: Secondary | ICD-10-CM | POA: Diagnosis not present

## 2017-02-11 DIAGNOSIS — M75121 Complete rotator cuff tear or rupture of right shoulder, not specified as traumatic: Secondary | ICD-10-CM | POA: Diagnosis not present

## 2017-02-12 DIAGNOSIS — M25511 Pain in right shoulder: Secondary | ICD-10-CM | POA: Diagnosis not present

## 2017-03-10 ENCOUNTER — Other Ambulatory Visit: Payer: Self-pay | Admitting: Cardiology

## 2017-03-24 DIAGNOSIS — I10 Essential (primary) hypertension: Secondary | ICD-10-CM | POA: Diagnosis not present

## 2017-03-24 DIAGNOSIS — Z6836 Body mass index (BMI) 36.0-36.9, adult: Secondary | ICD-10-CM | POA: Diagnosis not present

## 2017-03-24 DIAGNOSIS — E063 Autoimmune thyroiditis: Secondary | ICD-10-CM | POA: Diagnosis not present

## 2017-03-24 DIAGNOSIS — F17290 Nicotine dependence, other tobacco product, uncomplicated: Secondary | ICD-10-CM | POA: Diagnosis not present

## 2017-03-24 DIAGNOSIS — G894 Chronic pain syndrome: Secondary | ICD-10-CM | POA: Diagnosis not present

## 2017-03-24 DIAGNOSIS — I208 Other forms of angina pectoris: Secondary | ICD-10-CM | POA: Diagnosis not present

## 2017-03-30 DIAGNOSIS — Z1211 Encounter for screening for malignant neoplasm of colon: Secondary | ICD-10-CM | POA: Diagnosis not present

## 2017-04-14 DIAGNOSIS — I2510 Atherosclerotic heart disease of native coronary artery without angina pectoris: Secondary | ICD-10-CM | POA: Diagnosis not present

## 2017-04-14 DIAGNOSIS — E782 Mixed hyperlipidemia: Secondary | ICD-10-CM | POA: Diagnosis not present

## 2017-04-14 DIAGNOSIS — F419 Anxiety disorder, unspecified: Secondary | ICD-10-CM | POA: Diagnosis not present

## 2017-04-14 DIAGNOSIS — Z6837 Body mass index (BMI) 37.0-37.9, adult: Secondary | ICD-10-CM | POA: Diagnosis not present

## 2017-04-14 DIAGNOSIS — M159 Polyosteoarthritis, unspecified: Secondary | ICD-10-CM | POA: Diagnosis not present

## 2017-04-14 DIAGNOSIS — E063 Autoimmune thyroiditis: Secondary | ICD-10-CM | POA: Diagnosis not present

## 2017-04-14 DIAGNOSIS — Z1389 Encounter for screening for other disorder: Secondary | ICD-10-CM | POA: Diagnosis not present

## 2017-04-14 DIAGNOSIS — Z0001 Encounter for general adult medical examination with abnormal findings: Secondary | ICD-10-CM | POA: Diagnosis not present

## 2017-04-14 DIAGNOSIS — M1991 Primary osteoarthritis, unspecified site: Secondary | ICD-10-CM | POA: Diagnosis not present

## 2017-04-14 DIAGNOSIS — E669 Obesity, unspecified: Secondary | ICD-10-CM | POA: Diagnosis not present

## 2017-05-17 ENCOUNTER — Other Ambulatory Visit: Payer: Self-pay | Admitting: *Deleted

## 2017-05-17 MED ORDER — PANTOPRAZOLE SODIUM 40 MG PO TBEC: 40.0000 mg | DELAYED_RELEASE_TABLET | Freq: Every day | ORAL | 0 refills | Status: DC

## 2017-05-17 MED ORDER — METOPROLOL SUCCINATE ER 50 MG PO TB24: ORAL_TABLET | ORAL | 0 refills | Status: DC

## 2017-05-29 ENCOUNTER — Other Ambulatory Visit: Payer: Self-pay | Admitting: Cardiology

## 2017-05-29 LAB — GLUCOSE, POCT (MANUAL RESULT ENTRY): POC Glucose: 167 mg/dl — AB (ref 70–99)

## 2017-06-01 ENCOUNTER — Ambulatory Visit: Payer: PPO | Admitting: Cardiology

## 2017-06-01 ENCOUNTER — Encounter: Payer: Self-pay | Admitting: Cardiology

## 2017-06-01 VITALS — BP 120/66 | HR 63 | Ht 64.0 in | Wt 224.0 lb

## 2017-06-01 DIAGNOSIS — I6523 Occlusion and stenosis of bilateral carotid arteries: Secondary | ICD-10-CM

## 2017-06-01 DIAGNOSIS — E782 Mixed hyperlipidemia: Secondary | ICD-10-CM

## 2017-06-01 DIAGNOSIS — I10 Essential (primary) hypertension: Secondary | ICD-10-CM

## 2017-06-01 DIAGNOSIS — R0989 Other specified symptoms and signs involving the circulatory and respiratory systems: Secondary | ICD-10-CM

## 2017-06-01 DIAGNOSIS — I2510 Atherosclerotic heart disease of native coronary artery without angina pectoris: Secondary | ICD-10-CM

## 2017-06-01 MED ORDER — ATORVASTATIN CALCIUM 80 MG PO TABS: 80.0000 mg | ORAL_TABLET | Freq: Every day | ORAL | 1 refills | Status: DC

## 2017-06-01 NOTE — Patient Instructions (Signed)
Your physician wants you to follow-up in: 1 YEAR WITH DR. BRANCH  You will receive a reminder letter in the mail two months in advance. If you don't receive a letter, please call our office to schedule the follow-up appointment.  Your physician recommends that you continue on your current medications as directed. Please refer to the Current Medication list given to you today.  Your physician has requested that you have a carotid duplex. This test is an ultrasound of the carotid arteries in your neck. It looks at blood flow through these arteries that supply the brain with blood. Allow one hour for this exam. There are no restrictions or special instructions.  Thank you for choosing [facility]!!   

## 2017-06-01 NOTE — Progress Notes (Signed)
Clinical Summary [name] is a 67 y.o.male seen today for follow up of the following medical problems.   1.CAD - hx of CABG 05/2011 (LIMA-LAD) - echo 05/2011 LVEF >55%, aortic sclerosis, mild MR,   - no chest pain, no SOB/DOE  - compliant with meds.   2. Hyperlipidemia - remains compliant with statin. Labs done regularly by pcp   3. HTN  - does not check at home.  - compliant with meds  4. Carotid stenosis - 05/2016 moderate bilateral stenosis -no recent symptoms.    5. Tobacco abuse.  - tried chantix by pcp, caused severe drowsiness - currently using nicotine gum   Past Medical History:  Diagnosis Date  . Abnormal nuclear stress test 05/28/2011  . Angina at rest [facility]) 05/28/2011  . Arrhythmia    pt stated "it's ok cause no one is concerned about it"  . Arthritis   . Chest pain   . Coronary artery disease   . Depression   . Dyslipidemia 05/28/2011  . Hypertension   . Smoker   . Tobacco abuse 05/28/2011     Allergies  Allergen Reactions  . Penicillins Other (See Comments)    unknown  . Ramipril Cough     Current Outpatient Medications  Medication Sig Dispense Refill  . aspirin 81 MG tablet Take 81 mg by mouth daily.    Marland Kitchen atorvastatin (LIPITOR) 80 MG tablet Take 1 tablet (80 mg total) by mouth daily. 90 tablet 3  . ibuprofen (ADVIL,MOTRIN) 800 MG tablet Take 800 mg by mouth daily.    Marland Kitchen losartan (COZAAR) 50 MG tablet TAKE 1 TABLET BY MOUTH ONCE DAILY. 90 tablet 0  . metoprolol succinate (TOPROL-XL) 50 MG 24 hr tablet Take 1/2 tablet (25mg ) by mouth daily. Take with or immediately following a meal. 45 tablet 0  . Multiple Vitamin (MULTIVITAMIN) tablet Take 1 tablet by mouth daily.    . Omega-3 Fatty Acids (FISH OIL) 1000 MG CAPS Take 1,000 mg by mouth daily.    Marland Kitchen oxycodone (ROXICODONE) 30 MG immediate release tablet Take 1 tablet by mouth every 6 (six) hours as needed.    . pantoprazole (PROTONIX) 40 MG tablet Take 1 tablet (40 mg total) by  mouth daily. 90 tablet 0  . tamsulosin (FLOMAX) 0.4 MG CAPS capsule Take 0.4 mg by mouth daily.     No current facility-administered medications for this visit.      Past Surgical History:  Procedure Laterality Date  . CARDIAC CATHETERIZATION    . CERVICAL DISC SURGERY    . CORONARY ARTERY BYPASS GRAFT  05/27/2011   Procedure: OFF PUMP CORONARY ARTERY BYPASS GRAFTING (CABG);  Surgeon: Loreli Slot, MD;  Location: [facility] OR;  Service: Open Heart Surgery;  Laterality: N/A;  . KNEE ARTHROSCOPY W/ DEBRIDEMENT    . LEFT HEART CATHETERIZATION WITH CORONARY ANGIOGRAM N/A 05/26/2011   Procedure: LEFT HEART CATHETERIZATION WITH CORONARY ANGIOGRAM;  Surgeon: Chrystie Nose, MD;  Location: [facility] CATH LAB;  Service: Cardiovascular;  Laterality: N/A;     Allergies  Allergen Reactions  . Penicillins Other (See Comments)    unknown  . Ramipril Cough      Family History  Problem Relation Age of Onset  . Stroke Father   . Hypertension Mother   . Kidney disease Mother   . Heart disease Brother   . Kidney disease Brother        transplant  . Valvular heart disease Unknown 70  congenital/had sugery for mechanical valve installed     Social History [name] reports that he has been smoking cigarettes.  He started smoking about 45 years ago. He has a 17.50 pack-year smoking history. He has never used smokeless tobacco. [name] reports that he does not drink alcohol.   Review of Systems CONSTITUTIONAL: No weight loss, fever, chills, weakness or fatigue.  HEENT: Eyes: No visual loss, blurred vision, double vision or yellow sclerae.No hearing loss, sneezing, congestion, runny nose or sore throat.  SKIN: No rash or itching.  CARDIOVASCULAR: per hpi RESPIRATORY: No shortness of breath, cough or sputum.  GASTROINTESTINAL: No anorexia, nausea, vomiting or diarrhea. No abdominal pain or blood.  GENITOURINARY: No burning on urination, no polyuria NEUROLOGICAL: No headache,  dizziness, syncope, paralysis, ataxia, numbness or tingling in the extremities. No change in bowel or bladder control.  MUSCULOSKELETAL: No muscle, back pain, joint pain or stiffness.  LYMPHATICS: No enlarged nodes. No history of splenectomy.  PSYCHIATRIC: No history of depression or anxiety.  ENDOCRINOLOGIC: No reports of sweating, cold or heat intolerance. No polyuria or polydipsia.  Marland Kitchen.   Physical Examination Vitals:   06/01/17 1338  BP: 120/66  Pulse: 63  SpO2: 95%   Vitals:   06/01/17 1338  Weight: 224 lb (101.6 kg)  Height: 5\' 4"  (1.626 m)    Gen: resting comfortably, no acute distress HEENT: no scleral icterus, pupils equal round and reactive, no palptable cervical adenopathy,  CV: RRR, no m/r/g, no jvd Resp: Clear to auscultation bilaterally GI: abdomen is soft, non-tender, non-distended, normal bowel sounds, no hepatosplenomegaly MSK: extremities are warm, no edema.  Skin: warm, no rash Neuro:  no focal deficits Psych: appropriate affect   Diagnostic Studies 05/2011 Cath Hemodynamics: Central Aortic Pressure / Mean Aortic Pressure: 120/76 LV Pressure / LV End diastolic Pressure: 20  Left Ventriculography: EF: 60-65% Wall Motion: No focal wall motion abnormalities  Coronary Angiographic Data:  Left Main:Short left main. Distal 20% stenosis into the ostial LAD.  Left Anterior Descending (LAD): There is a 90% ostial discrete lesion of the LAD. There is mild distal LAD disease.  1st diagonal (D1): Large vessel without significant stenosis.  Circumflex (LCx):Dominant vessel. There is a moderate stenosis of the LCX up to 50% distal to OM3.  1st obtuse marginal:Small vessel, high takeoff. No significant disease.  2nd obtuse marginal: Small vessel, no significant disease 3rd obtuse marginal: Large vessel which branches into 2 vessels supplying a good portion of the lateral wall. No significant  stenoses.  posterior lateral [name]: Supplies the septum.  Right Coronary Artery: Small to moderate-sized non-dominant vessel, no significant stenoses.  Impression: 1. High risk ostial LAD discrete stenosis (90%). 2. Moderate mid-distal LCx stenosis. Left dominant system. 3. LVEF >60%, no focal wall motion abnormalities.  Plan: 1. Discussed case with Dr. Allyson SabalBerry and Dr. Alanda AmassWeintraub. Stent would be high risk. Will ask CT surgery to evaluate for possible OPCABG. 2. Will admit due to high risk anatomy with hopefully surgical intervention in the next few days. He has not been on dual antiplatelets.  The case and results was discussed with the patient and family. The case and results was not discussed with the patient's PCP. The case and results was discussed with the patient's Cardiologist.  04/2016 AAA US No aneurysm  05/2016 carotid US 40-59% bilateral ICA stenosis    Assessment and Plan  1. CAD - no symptoms, continue secondary prevention  - EKG in clinic SR, no ischemic changes  2. HTN -  bp at goal, continue current meds  3. Hyperlipidemia -continue statin, request labs from pcp  4. Carotid bruits - repeat carotis [place]  5. LE edema - just occasional, will give lasix 20mg  prn.   F/u 1 year       Antoine Poche, M.D.

## 2017-06-03 ENCOUNTER — Encounter: Payer: Self-pay | Admitting: *Deleted

## 2017-06-06 ENCOUNTER — Encounter: Payer: Self-pay | Admitting: Cardiology

## 2017-06-17 DIAGNOSIS — G894 Chronic pain syndrome: Secondary | ICD-10-CM | POA: Diagnosis not present

## 2017-06-17 DIAGNOSIS — E669 Obesity, unspecified: Secondary | ICD-10-CM | POA: Diagnosis not present

## 2017-06-17 DIAGNOSIS — Z6838 Body mass index (BMI) 38.0-38.9, adult: Secondary | ICD-10-CM | POA: Diagnosis not present

## 2017-06-17 DIAGNOSIS — I10 Essential (primary) hypertension: Secondary | ICD-10-CM | POA: Diagnosis not present

## 2017-06-17 DIAGNOSIS — E063 Autoimmune thyroiditis: Secondary | ICD-10-CM | POA: Diagnosis not present

## 2017-06-17 DIAGNOSIS — M1991 Primary osteoarthritis, unspecified site: Secondary | ICD-10-CM | POA: Diagnosis not present

## 2017-06-17 DIAGNOSIS — I208 Other forms of angina pectoris: Secondary | ICD-10-CM | POA: Diagnosis not present

## 2017-06-23 ENCOUNTER — Ambulatory Visit (INDEPENDENT_AMBULATORY_CARE_PROVIDER_SITE_OTHER): Payer: PPO

## 2017-06-23 DIAGNOSIS — I6523 Occlusion and stenosis of bilateral carotid arteries: Secondary | ICD-10-CM | POA: Diagnosis not present

## 2017-06-29 ENCOUNTER — Telehealth: Payer: Self-pay | Admitting: *Deleted

## 2017-06-29 NOTE — Telephone Encounter (Signed)
-----   Message from Norva Pavlov, LPN sent at 0/98/1191  4:49 PM EDT -----   ----- Message ----- From: Antoine Poche, MD Sent: 06/28/2017   3:40 PM To: Norva Pavlov, LPN  Moderate blockages on both sides, continue to monitor at this time   J BrancH MD

## 2017-06-29 NOTE — Telephone Encounter (Signed)
Pt aware and voiced understanding - routed to pcp  

## 2017-09-10 DIAGNOSIS — G894 Chronic pain syndrome: Secondary | ICD-10-CM | POA: Diagnosis not present

## 2017-09-10 DIAGNOSIS — Z23 Encounter for immunization: Secondary | ICD-10-CM | POA: Diagnosis not present

## 2017-09-10 DIAGNOSIS — I10 Essential (primary) hypertension: Secondary | ICD-10-CM | POA: Diagnosis not present

## 2017-09-10 DIAGNOSIS — M1991 Primary osteoarthritis, unspecified site: Secondary | ICD-10-CM | POA: Diagnosis not present

## 2017-09-10 DIAGNOSIS — E063 Autoimmune thyroiditis: Secondary | ICD-10-CM | POA: Diagnosis not present

## 2017-09-10 DIAGNOSIS — E669 Obesity, unspecified: Secondary | ICD-10-CM | POA: Diagnosis not present

## 2017-09-10 DIAGNOSIS — Z6837 Body mass index (BMI) 37.0-37.9, adult: Secondary | ICD-10-CM | POA: Diagnosis not present

## 2017-09-20 ENCOUNTER — Other Ambulatory Visit: Payer: Self-pay | Admitting: Cardiovascular Disease

## 2017-11-23 ENCOUNTER — Other Ambulatory Visit: Payer: Self-pay | Admitting: *Deleted

## 2017-11-23 DIAGNOSIS — I2510 Atherosclerotic heart disease of native coronary artery without angina pectoris: Secondary | ICD-10-CM

## 2017-11-27 ENCOUNTER — Other Ambulatory Visit: Payer: Self-pay | Admitting: Cardiology

## 2017-11-27 ENCOUNTER — Other Ambulatory Visit: Payer: Self-pay | Admitting: Cardiovascular Disease

## 2017-11-29 DIAGNOSIS — G894 Chronic pain syndrome: Secondary | ICD-10-CM | POA: Diagnosis not present

## 2017-11-29 DIAGNOSIS — E063 Autoimmune thyroiditis: Secondary | ICD-10-CM | POA: Diagnosis not present

## 2017-11-29 DIAGNOSIS — I10 Essential (primary) hypertension: Secondary | ICD-10-CM | POA: Diagnosis not present

## 2017-11-29 DIAGNOSIS — Z6837 Body mass index (BMI) 37.0-37.9, adult: Secondary | ICD-10-CM | POA: Diagnosis not present

## 2017-11-29 DIAGNOSIS — B351 Tinea unguium: Secondary | ICD-10-CM | POA: Diagnosis not present

## 2017-11-29 DIAGNOSIS — M1991 Primary osteoarthritis, unspecified site: Secondary | ICD-10-CM | POA: Diagnosis not present

## 2017-11-29 DIAGNOSIS — Z1389 Encounter for screening for other disorder: Secondary | ICD-10-CM | POA: Diagnosis not present

## 2017-11-29 DIAGNOSIS — M150 Primary generalized (osteo)arthritis: Secondary | ICD-10-CM | POA: Diagnosis not present

## 2017-12-02 ENCOUNTER — Other Ambulatory Visit: Payer: Self-pay | Admitting: *Deleted

## 2017-12-02 DIAGNOSIS — I2510 Atherosclerotic heart disease of native coronary artery without angina pectoris: Secondary | ICD-10-CM

## 2017-12-06 ENCOUNTER — Other Ambulatory Visit: Payer: Self-pay | Admitting: Cardiovascular Disease

## 2017-12-08 ENCOUNTER — Other Ambulatory Visit: Payer: Self-pay | Admitting: Cardiology

## 2017-12-08 DIAGNOSIS — I6523 Occlusion and stenosis of bilateral carotid arteries: Secondary | ICD-10-CM

## 2018-01-01 ENCOUNTER — Other Ambulatory Visit: Payer: Self-pay | Admitting: Cardiology

## 2018-02-17 DIAGNOSIS — I10 Essential (primary) hypertension: Secondary | ICD-10-CM | POA: Diagnosis not present

## 2018-02-17 DIAGNOSIS — G894 Chronic pain syndrome: Secondary | ICD-10-CM | POA: Diagnosis not present

## 2018-02-17 DIAGNOSIS — G4700 Insomnia, unspecified: Secondary | ICD-10-CM | POA: Diagnosis not present

## 2018-02-17 DIAGNOSIS — Z6838 Body mass index (BMI) 38.0-38.9, adult: Secondary | ICD-10-CM | POA: Diagnosis not present

## 2018-02-17 DIAGNOSIS — Z1389 Encounter for screening for other disorder: Secondary | ICD-10-CM | POA: Diagnosis not present

## 2018-03-28 ENCOUNTER — Encounter: Payer: Self-pay | Admitting: Cardiology

## 2018-03-28 ENCOUNTER — Other Ambulatory Visit: Payer: Self-pay | Admitting: Cardiology

## 2018-03-28 DIAGNOSIS — Z0000 Encounter for general adult medical examination without abnormal findings: Secondary | ICD-10-CM | POA: Diagnosis not present

## 2018-03-28 DIAGNOSIS — E7849 Other hyperlipidemia: Secondary | ICD-10-CM | POA: Diagnosis not present

## 2018-03-30 DIAGNOSIS — E7849 Other hyperlipidemia: Secondary | ICD-10-CM | POA: Diagnosis not present

## 2018-03-30 DIAGNOSIS — N400 Enlarged prostate without lower urinary tract symptoms: Secondary | ICD-10-CM | POA: Diagnosis not present

## 2018-03-30 DIAGNOSIS — G894 Chronic pain syndrome: Secondary | ICD-10-CM | POA: Diagnosis not present

## 2018-03-30 DIAGNOSIS — D72829 Elevated white blood cell count, unspecified: Secondary | ICD-10-CM | POA: Diagnosis not present

## 2018-03-30 DIAGNOSIS — Z0001 Encounter for general adult medical examination with abnormal findings: Secondary | ICD-10-CM | POA: Diagnosis not present

## 2018-03-30 DIAGNOSIS — I10 Essential (primary) hypertension: Secondary | ICD-10-CM | POA: Diagnosis not present

## 2018-03-30 DIAGNOSIS — Z1389 Encounter for screening for other disorder: Secondary | ICD-10-CM | POA: Diagnosis not present

## 2018-03-30 DIAGNOSIS — Z6838 Body mass index (BMI) 38.0-38.9, adult: Secondary | ICD-10-CM | POA: Diagnosis not present

## 2018-04-02 DIAGNOSIS — Z1211 Encounter for screening for malignant neoplasm of colon: Secondary | ICD-10-CM | POA: Diagnosis not present

## 2018-05-12 DIAGNOSIS — G894 Chronic pain syndrome: Secondary | ICD-10-CM | POA: Diagnosis not present

## 2018-05-12 DIAGNOSIS — I10 Essential (primary) hypertension: Secondary | ICD-10-CM | POA: Diagnosis not present

## 2018-05-12 DIAGNOSIS — M1991 Primary osteoarthritis, unspecified site: Secondary | ICD-10-CM | POA: Diagnosis not present

## 2018-05-12 DIAGNOSIS — Z6838 Body mass index (BMI) 38.0-38.9, adult: Secondary | ICD-10-CM | POA: Diagnosis not present

## 2018-05-12 DIAGNOSIS — R202 Paresthesia of skin: Secondary | ICD-10-CM | POA: Diagnosis not present

## 2018-05-12 DIAGNOSIS — G5602 Carpal tunnel syndrome, left upper limb: Secondary | ICD-10-CM | POA: Diagnosis not present

## 2018-05-31 ENCOUNTER — Other Ambulatory Visit: Payer: Self-pay | Admitting: Cardiology

## 2018-06-21 ENCOUNTER — Other Ambulatory Visit: Payer: Self-pay | Admitting: *Deleted

## 2018-06-21 DIAGNOSIS — I208 Other forms of angina pectoris: Secondary | ICD-10-CM | POA: Diagnosis not present

## 2018-06-21 DIAGNOSIS — I6523 Occlusion and stenosis of bilateral carotid arteries: Secondary | ICD-10-CM

## 2018-06-21 DIAGNOSIS — G894 Chronic pain syndrome: Secondary | ICD-10-CM | POA: Diagnosis not present

## 2018-06-21 DIAGNOSIS — N400 Enlarged prostate without lower urinary tract symptoms: Secondary | ICD-10-CM | POA: Diagnosis not present

## 2018-06-21 DIAGNOSIS — Z6839 Body mass index (BMI) 39.0-39.9, adult: Secondary | ICD-10-CM | POA: Diagnosis not present

## 2018-06-21 DIAGNOSIS — S338XXA Sprain of other parts of lumbar spine and pelvis, initial encounter: Secondary | ICD-10-CM | POA: Diagnosis not present

## 2018-06-21 DIAGNOSIS — E063 Autoimmune thyroiditis: Secondary | ICD-10-CM | POA: Diagnosis not present

## 2018-06-21 DIAGNOSIS — I2510 Atherosclerotic heart disease of native coronary artery without angina pectoris: Secondary | ICD-10-CM

## 2018-06-27 ENCOUNTER — Other Ambulatory Visit: Payer: Self-pay | Admitting: Cardiology

## 2018-06-28 ENCOUNTER — Telehealth: Payer: Self-pay | Admitting: *Deleted

## 2018-06-28 NOTE — Telephone Encounter (Signed)
   Primary Cardiologist:  Dina Rich, MD   Patient contacted.  History reviewed.  No symptoms to suggest any unstable cardiac conditions.  Based on discussion, with current pandemic situation, we will be postponing this appointment for [facility] with a plan for f/u in July 2020 or sooner if feasible/necessary.  If symptoms change, he has been instructed to contact our office.    Thalia Bloodgood, RN  06/28/2018 3:21 PM         .

## 2018-07-01 ENCOUNTER — Ambulatory Visit: Payer: PPO | Admitting: Cardiology

## 2018-07-11 ENCOUNTER — Telehealth: Payer: Self-pay | Admitting: Cardiology

## 2018-07-11 MED ORDER — ATORVASTATIN CALCIUM 80 MG PO TABS: 80.0000 mg | ORAL_TABLET | Freq: Every day | ORAL | 0 refills | Status: DC

## 2018-07-11 NOTE — Telephone Encounter (Signed)
°*  STAT* If patient is at the pharmacy, call can be transferred to refill team.   1. Which medications need to be refilled?atorvastatin (LIPITOR) 80 MG tablet    2. Which pharmacy/location (including street and city if local pharmacy) is medication to be sent to? [facility] [place]  3. Do they need a 30 day or 90 day supply?

## 2018-07-11 NOTE — Telephone Encounter (Signed)
Medication sent to pharmacy  

## 2018-07-19 ENCOUNTER — Telehealth: Payer: Self-pay | Admitting: Cardiology

## 2018-07-19 NOTE — Telephone Encounter (Signed)
° ° °  COVID-19 Pre-Screening Questions: ° °• In the past 7 to 10 days have you had a cough,  shortness of breath, headache, congestion, fever, body aches, chills, sore throat, or sudden loss of taste or sense of smell?  NO °• Have you been around anyone with known Covid 19.  NO °• Have you been around anyone who is awaiting Covid 19 test results in the past 7 to 10 days?  NO °• Have you been around anyone who has been exposed to Covid 19, or has mentioned symptoms of Covid 19 within the past 7 to 10 days?  NO ° °If you have any concerns/questions about symptoms patients report during screening (either on the phone or at threshold). Contact the provider seeing the patient or DOD for further guidance.  If neither are available contact a member of the leadership team. ° ° ° °   ° ° ° ° °

## 2018-07-20 ENCOUNTER — Ambulatory Visit (INDEPENDENT_AMBULATORY_CARE_PROVIDER_SITE_OTHER): Payer: PPO

## 2018-07-20 ENCOUNTER — Other Ambulatory Visit: Payer: Self-pay

## 2018-07-20 ENCOUNTER — Other Ambulatory Visit: Payer: Self-pay | Admitting: Cardiology

## 2018-07-20 DIAGNOSIS — I2510 Atherosclerotic heart disease of native coronary artery without angina pectoris: Secondary | ICD-10-CM

## 2018-07-20 DIAGNOSIS — I6523 Occlusion and stenosis of bilateral carotid arteries: Secondary | ICD-10-CM

## 2018-07-27 ENCOUNTER — Telehealth: Payer: Self-pay | Admitting: *Deleted

## 2018-07-27 NOTE — Telephone Encounter (Signed)
-----   Message from Antoine Poche, MD sent at 07/26/2018 10:42 AM EDT ----- Carotid US shows just mild blockages on both sides, we will continue to monitor   Dominga Ferry MD

## 2018-07-27 NOTE — Telephone Encounter (Signed)
Pt voiced understanding - routed to pcp  

## 2018-08-24 ENCOUNTER — Telehealth: Payer: Self-pay | Admitting: Cardiology

## 2018-08-24 NOTE — Telephone Encounter (Signed)
Virtual Visit Pre-Appointment Phone Call  "(Name), I am calling you today to discuss your upcoming appointment. We are currently trying to limit exposure to the virus that causes COVID-19 by seeing patients at home rather than in the office."  1. "What is the BEST phone number to call the day of the visit?" - include this in appointment notes  2. Do you have or have access to (through a family member/friend) a smartphone with video capability that we can use for your visit?" a. If yes - list this number in appt notes as cell (if different from BEST phone #) and list the appointment type as a VIDEO visit in appointment notes b. If no - list the appointment type as a PHONE visit in appointment notes  3. Confirm consent - "In the setting of the current Covid19 crisis, you are scheduled for a (phone or video) visit with your provider on (date) at (time).  Just as we do with many in-office visits, in order for you to participate in this visit, we must obtain consent.  If you'd like, I can send this to your mychart (if signed up) or email for you to review.  Otherwise, I can obtain your verbal consent now.  All virtual visits are billed to your insurance company just like a normal visit would be.  By agreeing to a virtual visit, we'd like you to understand that the technology does not allow for your provider to perform an examination, and thus may limit your provider's ability to fully assess your condition. If your provider identifies any concerns that need to be evaluated in person, we will make arrangements to do so.  Finally, though the technology is pretty good, we cannot assure that it will always work on either your or our end, and in the setting of a video visit, we may have to convert it to a phone-only visit.  In either situation, we cannot ensure that we have a secure connection.  Are you willing to proceed?" STAFF: Did the patient verbally acknowledge consent to telehealth visit? Document  YES/NO here: yes  4. Advise patient to be prepared - "Two hours prior to your appointment, go ahead and check your blood pressure, pulse, oxygen saturation, and your weight (if you have the equipment to check those) and write them all down. When your visit starts, your provider will ask you for this information. If you have an Apple Watch or Kardia device, please plan to have heart rate information ready on the day of your appointment. Please have a pen and paper handy nearby the day of the visit as well."  5. Give patient instructions for MyChart download to smartphone OR Doximity/Doxy.me as below if video visit (depending on what platform provider is using)  6. Inform patient they will receive a phone call 15 minutes prior to their appointment time (may be from unknown caller ID) so they should be prepared to answer    TELEPHONE CALL NOTE  [name] has been deemed a candidate for a follow-up tele-health visit to limit community exposure during the Covid-19 pandemic. I spoke with the patient via phone to ensure availability of phone/video source, confirm preferred email & phone number, and discuss instructions and expectations.  I reminded Jacob AlaVincent Stead to be prepared with any vital sign and/or heart rhythm information that could potentially be obtained via home monitoring, at the time of his visit. I reminded Oswaldo DoneVincent Hindle to expect a phone call prior to his visit.  Weston Anna 08/24/2018 10:40 AM   INSTRUCTIONS FOR DOWNLOADING THE MYCHART APP TO SMARTPHONE  - The patient must first make sure to have activated MyChart and know their login information - If Apple, go to [employer] and type in MyChart in the search bar and download the app. If Android, ask patient to go to [employer] and type in [place] in the search bar and download the app. The app is free but as with any other app downloads, their phone may require them to verify saved payment information or  Apple/Android password.  - The patient will need to then log into the app with their MyChart username and password, and select [facility] as their healthcare provider to link the account. When it is time for your visit, go to the MyChart app, find appointments, and click Begin Video Visit. Be sure to Select Allow for your device to access the Microphone and Camera for your visit. You will then be connected, and your provider will be with you shortly.  **If they have any issues connecting, or need assistance please contact MyChart service desk [phone])**  **If using a computer, in order to ensure the best quality for their visit they will need to use either of the following Internet Browsers: [employer], or Google Chrome**  IF USING DOXIMITY or DOXY.ME - The patient will receive a link just prior to their visit by text.     FULL LENGTH CONSENT FOR TELE-HEALTH VISIT   I hereby voluntarily request, consent and authorize [facility] and its employed or contracted physicians, physician assistants, nurse practitioners or other licensed health care professionals (the Practitioner), to provide me with telemedicine health care services (the Services") as deemed necessary by the treating Practitioner. I acknowledge and consent to receive the Services by the Practitioner via telemedicine. I understand that the telemedicine visit will involve communicating with the Practitioner through live audiovisual communication technology and the disclosure of certain medical information by electronic transmission. I acknowledge that I have been given the opportunity to request an in-person assessment or other available alternative prior to the telemedicine visit and am voluntarily participating in the telemedicine visit.  I understand that I have the right to withhold or withdraw my consent to the use of telemedicine in the course of my care at any time, without affecting my right to future care  or treatment, and that the Practitioner or I may terminate the telemedicine visit at any time. I understand that I have the right to inspect all information obtained and/or recorded in the course of the telemedicine visit and may receive copies of available information for a reasonable fee.  I understand that some of the potential risks of receiving the Services via telemedicine include:   Delay or interruption in medical evaluation due to technological equipment failure or disruption;  Information transmitted may not be sufficient (e.g. poor resolution of images) to allow for appropriate medical decision making by the Practitioner; and/or   In rare instances, security protocols could fail, causing a breach of personal health information.  Furthermore, I acknowledge that it is my responsibility to provide information about my medical history, conditions and care that is complete and accurate to the best of my ability. I acknowledge that Practitioner's advice, recommendations, and/or decision may be based on factors not within their control, such as incomplete or inaccurate data provided by me or distortions of diagnostic images or specimens that may result from electronic transmissions. I understand that the  practice of medicine is not an Chief Strategy Officer and that Practitioner makes no warranties or guarantees regarding treatment outcomes. I acknowledge that I will receive a copy of this consent concurrently upon execution via email to the email address I last provided but may also request a printed copy by calling the office of [facility].    I understand that my insurance will be billed for this visit.   I have read or had this consent read to me.  I understand the contents of this consent, which adequately explains the benefits and risks of the Services being provided via telemedicine.   I have been provided ample opportunity to ask questions regarding this consent and the Services and have had  my questions answered to my satisfaction.  I give my informed consent for the services to be provided through the use of telemedicine in my medical care  By participating in this telemedicine visit I agree to the above.

## 2018-08-29 ENCOUNTER — Other Ambulatory Visit: Payer: Self-pay | Admitting: *Deleted

## 2018-08-29 DIAGNOSIS — G894 Chronic pain syndrome: Secondary | ICD-10-CM | POA: Diagnosis not present

## 2018-08-29 DIAGNOSIS — Z6837 Body mass index (BMI) 37.0-37.9, adult: Secondary | ICD-10-CM | POA: Diagnosis not present

## 2018-08-29 DIAGNOSIS — Z1389 Encounter for screening for other disorder: Secondary | ICD-10-CM | POA: Diagnosis not present

## 2018-08-29 MED ORDER — LOSARTAN POTASSIUM 50 MG PO TABS: 50.0000 mg | ORAL_TABLET | Freq: Every day | ORAL | 0 refills | Status: DC

## 2018-08-31 ENCOUNTER — Telehealth (INDEPENDENT_AMBULATORY_CARE_PROVIDER_SITE_OTHER): Payer: PPO | Admitting: Cardiology

## 2018-08-31 ENCOUNTER — Encounter: Payer: Self-pay | Admitting: *Deleted

## 2018-08-31 ENCOUNTER — Encounter: Payer: Self-pay | Admitting: Cardiology

## 2018-08-31 VITALS — BP 130/70 | Ht 64.0 in | Wt 220.0 lb

## 2018-08-31 DIAGNOSIS — I2510 Atherosclerotic heart disease of native coronary artery without angina pectoris: Secondary | ICD-10-CM

## 2018-08-31 DIAGNOSIS — I10 Essential (primary) hypertension: Secondary | ICD-10-CM

## 2018-08-31 DIAGNOSIS — E782 Mixed hyperlipidemia: Secondary | ICD-10-CM

## 2018-08-31 DIAGNOSIS — I6523 Occlusion and stenosis of bilateral carotid arteries: Secondary | ICD-10-CM

## 2018-08-31 NOTE — Patient Instructions (Signed)

## 2018-08-31 NOTE — Progress Notes (Signed)
Virtual Visit via Telephone Note   This visit type was conducted due to national recommendations for restrictions regarding the COVID-19 Pandemic (e.g. social distancing) in an effort to limit this patient's exposure and mitigate transmission in our community.  Due to his co-morbid illnesses, this patient is at least at moderate risk for complications without adequate follow up.  This format is felt to be most appropriate for this patient at this time.  The patient did not have access to video technology/had technical difficulties with video requiring transitioning to audio format only (telephone).  All issues noted in this document were discussed and addressed.  No physical exam could be performed with this format.  Please refer to the patient's chart for his  consent to telehealth [facility].   Date:  08/31/2018   ID:  Jacob AlaVincent Whitfill, DOB 06/22/1950, MRN [id number]  Patient Location: Home Provider Location: Office  PCP:  Elfredia NevinsFusco, Lawrence, MD  Cardiologist:  Dina RichBranch, Jonathan, MD  Electrophysiologist:  None   Evaluation Performed:  Follow-Up Visit  Chief Complaint:  1 year follow up  History of Present Illness:    [name] is a [age] y.o. male seen today for follow up of the following medical problems.  1.CAD - hx of CABG 05/2011 (LIMA-LAD) - echo 05/2011 LVEF >55%, aortic sclerosis, mild MR,    - denies any chest, no SOB/DOE - compliant with meds  2. Hyperlipidemi  - labs followed by pcp - compliant with statin  3. HTN - compliant with meds  4. Carotid stenosis 07/2018 carotid mild bilateral disease - no neuro symptoms      The patient does not have symptoms concerning for COVID-19 infection (fever, chills, cough, or new shortness of breath).    Past Medical History:  Diagnosis Date  . Abnormal nuclear stress test 05/28/2011  . Angina at rest [facility]) 05/28/2011  . Arrhythmia    pt stated "it's ok cause no one is concerned about it"  . Arthritis    . Chest pain   . Coronary artery disease   . Depression   . Dyslipidemia 05/28/2011  . Hypertension   . Smoker   . Tobacco abuse 05/28/2011   Past Surgical History:  Procedure Laterality Date  . CARDIAC CATHETERIZATION    . CERVICAL DISC SURGERY    . CORONARY ARTERY BYPASS GRAFT  05/27/2011   Procedure: OFF PUMP CORONARY ARTERY BYPASS GRAFTING (CABG);  Surgeon: Loreli SlotSteven C Hendrickson, MD;  [facility];  Service: Open Heart Surgery;  Laterality: N/A;  . KNEE ARTHROSCOPY W/ DEBRIDEMENT    . LEFT HEART CATHETERIZATION WITH CORONARY ANGIOGRAM N/A 05/26/2011   Procedure: LEFT HEART CATHETERIZATION WITH CORONARY ANGIOGRAM;  Surgeon: Chrystie NoseKenneth C. Hilty, MD;  [facility];  Service: Cardiovascular;  Laterality: N/A;  . OTHER SURGICAL HISTORY  2018   shoulder (right)      No outpatient medications have been marked as taking for the 08/31/18 encounter (Appointment) with Antoine PocheBranch, Jonathan F, MD.     Allergies:   Penicillins and Ramipril   Social History   Tobacco Use  . Smoking status: Current Every Day Smoker    Packs/day: 0.50    Years: 35.00    Pack years: 17.50    Types: Cigarettes    Start date: 10/06/1971  . Smokeless tobacco: Never Used  Substance Use Topics  . Alcohol use: No    Alcohol/week: 0.0 standard drinks  . Drug use: No     Family Hx: The patient's family history  includes Heart disease in his brother; Hypertension in his mother; Kidney disease in his brother and mother; Stroke in his father; Valvular heart disease (age of onset: 1170) in his unknown relative.  ROS:   Please see the history of present illness.     All other systems reviewed and are negative.   Prior CV studies:   The following studies were reviewed today:  05/2011 Cath Hemodynamics: Central Aortic Pressure / Mean Aortic Pressure: 120/76 LV Pressure / LV End diastolic Pressure: 20  Left Ventriculography: EF: 60-65% Wall Motion: No focal  wall motion abnormalities  Coronary Angiographic Data:  Left Main:Short left main. Distal 20% stenosis into the ostial LAD.  Left Anterior Descending (LAD): There is a 90% ostial discrete lesion of the LAD. There is mild distal LAD disease.  1st diagonal (D1): Large vessel without significant stenosis.  Circumflex (LCx):Dominant vessel. There is a moderate stenosis of the LCX up to 50% distal to OM3.  1st obtuse marginal:Small vessel, high takeoff. No significant disease.  2nd obtuse marginal: Small vessel, no significant disease 3rd obtuse marginal: Large vessel which branches into 2 vessels supplying a good portion of the lateral wall. No significant stenoses.  posterior lateral branch: Supplies the septum.  Right Coronary Artery: Small to moderate-sized non-dominant vessel, no significant stenoses.  Impression: 1. High risk ostial LAD discrete stenosis (90%). 2. Moderate mid-distal LCx stenosis. Left dominant system. 3. LVEF >60%, no focal wall motion abnormalities.  Plan: 1. Discussed case with Dr. Allyson SabalBerry and Dr. Alanda AmassWeintraub. Stent would be high risk. Will ask CT surgery to evaluate for possible OPCABG. 2. Will admit due to high risk anatomy with hopefully surgical intervention in the next few days. He has not been on dual antiplatelets.  The case and results was discussed with the patient and family. The case and results was not discussed with the patient's PCP. The case and results was discussed with the patient's Cardiologist.  04/2016 AAA US No aneurysm  05/2016 carotid US 40-59% bilateral ICA stenosis    Labs/Other Tests and Data Reviewed:    EKG:  No ECG reviewed.  Recent Labs: No results found for requested labs within last 8760 hours.   Recent Lipid Panel Lab Results  Component Value Date/Time   CHOL 136 11/03/2013 09:00 AM   TRIG 74 11/03/2013 09:00 AM   HDL 47 11/03/2013 09:00 AM   CHOLHDL 2.9 11/03/2013 09:00 AM   LDLCALC  74 11/03/2013 09:00 AM    Wt Readings from Last 3 Encounters:  06/01/17 224 lb (101.6 kg)  05/22/16 220 lb 12.8 oz (100.2 kg)  05/13/15 215 lb (97.5 kg)     Objective:    Vital Signs:   Today's Vitals   08/31/18 0838  BP: 130/70  Weight: 220 lb (99.8 kg)  Height: 5\' 4"  (1.626 m)   Body mass index is 37.76 kg/m. Normal affect. Normal speech pattern and tone. No audible signs of SOB or wheezing. Comfortable, no apparent distress  ASSESSMENT & PLAN:    1. CAD -doing well without symptoms, continue current meds  2. HTN -he is at goal, continue current meds  3. Hyperlipidemia -request pcp labs, continue statin  4. Carotid bruits - mild disease by recent US, continue to monitor   COVID-19 Education: The signs and symptoms of COVID-19 were discussed with the patient and how to seek care for testing (follow up with PCP or arrange E-visit).  The importance of social distancing was discussed today.  Time:   Today, I  have spent 18 minutes with the patient with telehealth technology discussing the above problems.     Medication Adjustments/Labs and Tests Ordered: Current medicines are reviewed at length with the patient today.  Concerns regarding medicines are outlined above.   Tests Ordered: No orders of the defined types were placed in this encounter.   Medication Changes: No orders of the defined types were placed in this encounter.   Follow Up:  In Person in 1 year(s)  Signed, Carlyle Dolly, MD  08/31/2018 8:19 AM    [facility]

## 2018-09-26 DIAGNOSIS — G894 Chronic pain syndrome: Secondary | ICD-10-CM | POA: Diagnosis not present

## 2018-09-26 DIAGNOSIS — Z6838 Body mass index (BMI) 38.0-38.9, adult: Secondary | ICD-10-CM | POA: Diagnosis not present

## 2018-10-11 ENCOUNTER — Other Ambulatory Visit: Payer: Self-pay | Admitting: Cardiology

## 2018-10-28 DIAGNOSIS — G894 Chronic pain syndrome: Secondary | ICD-10-CM | POA: Diagnosis not present

## 2018-10-28 DIAGNOSIS — E063 Autoimmune thyroiditis: Secondary | ICD-10-CM | POA: Diagnosis not present

## 2018-10-28 DIAGNOSIS — Z6837 Body mass index (BMI) 37.0-37.9, adult: Secondary | ICD-10-CM | POA: Diagnosis not present

## 2018-10-28 DIAGNOSIS — I2510 Atherosclerotic heart disease of native coronary artery without angina pectoris: Secondary | ICD-10-CM | POA: Diagnosis not present

## 2018-10-28 DIAGNOSIS — I10 Essential (primary) hypertension: Secondary | ICD-10-CM | POA: Diagnosis not present

## 2018-11-25 DIAGNOSIS — I10 Essential (primary) hypertension: Secondary | ICD-10-CM | POA: Diagnosis not present

## 2018-11-25 DIAGNOSIS — I208 Other forms of angina pectoris: Secondary | ICD-10-CM | POA: Diagnosis not present

## 2018-11-25 DIAGNOSIS — G894 Chronic pain syndrome: Secondary | ICD-10-CM | POA: Diagnosis not present

## 2018-11-25 DIAGNOSIS — Z6838 Body mass index (BMI) 38.0-38.9, adult: Secondary | ICD-10-CM | POA: Diagnosis not present

## 2018-11-25 DIAGNOSIS — Z23 Encounter for immunization: Secondary | ICD-10-CM | POA: Diagnosis not present

## 2018-11-25 DIAGNOSIS — M1991 Primary osteoarthritis, unspecified site: Secondary | ICD-10-CM | POA: Diagnosis not present

## 2018-11-28 ENCOUNTER — Other Ambulatory Visit: Payer: Self-pay | Admitting: Cardiology

## 2018-12-21 ENCOUNTER — Other Ambulatory Visit: Payer: Self-pay | Admitting: Cardiology

## 2018-12-26 DIAGNOSIS — E063 Autoimmune thyroiditis: Secondary | ICD-10-CM | POA: Diagnosis not present

## 2018-12-26 DIAGNOSIS — G894 Chronic pain syndrome: Secondary | ICD-10-CM | POA: Diagnosis not present

## 2018-12-26 DIAGNOSIS — Z6837 Body mass index (BMI) 37.0-37.9, adult: Secondary | ICD-10-CM | POA: Diagnosis not present

## 2018-12-26 DIAGNOSIS — I208 Other forms of angina pectoris: Secondary | ICD-10-CM | POA: Diagnosis not present

## 2018-12-26 DIAGNOSIS — M1991 Primary osteoarthritis, unspecified site: Secondary | ICD-10-CM | POA: Diagnosis not present

## 2018-12-26 DIAGNOSIS — I10 Essential (primary) hypertension: Secondary | ICD-10-CM | POA: Diagnosis not present

## 2018-12-26 DIAGNOSIS — N400 Enlarged prostate without lower urinary tract symptoms: Secondary | ICD-10-CM | POA: Diagnosis not present

## 2019-01-18 DIAGNOSIS — E669 Obesity, unspecified: Secondary | ICD-10-CM | POA: Diagnosis not present

## 2019-01-18 DIAGNOSIS — G894 Chronic pain syndrome: Secondary | ICD-10-CM | POA: Diagnosis not present

## 2019-02-17 DIAGNOSIS — E669 Obesity, unspecified: Secondary | ICD-10-CM | POA: Diagnosis not present

## 2019-02-17 DIAGNOSIS — Z6837 Body mass index (BMI) 37.0-37.9, adult: Secondary | ICD-10-CM | POA: Diagnosis not present

## 2019-02-17 DIAGNOSIS — M1991 Primary osteoarthritis, unspecified site: Secondary | ICD-10-CM | POA: Diagnosis not present

## 2019-02-17 DIAGNOSIS — G894 Chronic pain syndrome: Secondary | ICD-10-CM | POA: Diagnosis not present

## 2019-02-17 DIAGNOSIS — E063 Autoimmune thyroiditis: Secondary | ICD-10-CM | POA: Diagnosis not present

## 2019-02-27 ENCOUNTER — Other Ambulatory Visit: Payer: Self-pay | Admitting: Cardiology

## 2019-03-17 DIAGNOSIS — Z6837 Body mass index (BMI) 37.0-37.9, adult: Secondary | ICD-10-CM | POA: Diagnosis not present

## 2019-03-17 DIAGNOSIS — M79674 Pain in right toe(s): Secondary | ICD-10-CM | POA: Diagnosis not present

## 2019-03-17 DIAGNOSIS — G894 Chronic pain syndrome: Secondary | ICD-10-CM | POA: Diagnosis not present

## 2019-03-18 ENCOUNTER — Other Ambulatory Visit: Payer: Self-pay | Admitting: Cardiology

## 2019-04-14 DIAGNOSIS — G894 Chronic pain syndrome: Secondary | ICD-10-CM | POA: Diagnosis not present

## 2019-04-17 ENCOUNTER — Other Ambulatory Visit: Payer: Self-pay | Admitting: Cardiology

## 2019-05-12 DIAGNOSIS — E7849 Other hyperlipidemia: Secondary | ICD-10-CM | POA: Diagnosis not present

## 2019-05-12 DIAGNOSIS — M1991 Primary osteoarthritis, unspecified site: Secondary | ICD-10-CM | POA: Diagnosis not present

## 2019-05-12 DIAGNOSIS — G894 Chronic pain syndrome: Secondary | ICD-10-CM | POA: Diagnosis not present

## 2019-05-12 DIAGNOSIS — I10 Essential (primary) hypertension: Secondary | ICD-10-CM | POA: Diagnosis not present

## 2019-05-12 DIAGNOSIS — Z1389 Encounter for screening for other disorder: Secondary | ICD-10-CM | POA: Diagnosis not present

## 2019-05-12 DIAGNOSIS — N400 Enlarged prostate without lower urinary tract symptoms: Secondary | ICD-10-CM | POA: Diagnosis not present

## 2019-05-12 DIAGNOSIS — Z0000 Encounter for general adult medical examination without abnormal findings: Secondary | ICD-10-CM | POA: Diagnosis not present

## 2019-05-12 DIAGNOSIS — Z681 Body mass index (BMI) 19 or less, adult: Secondary | ICD-10-CM | POA: Diagnosis not present

## 2019-06-13 DIAGNOSIS — E669 Obesity, unspecified: Secondary | ICD-10-CM | POA: Diagnosis not present

## 2019-06-13 DIAGNOSIS — M1991 Primary osteoarthritis, unspecified site: Secondary | ICD-10-CM | POA: Diagnosis not present

## 2019-06-13 DIAGNOSIS — G894 Chronic pain syndrome: Secondary | ICD-10-CM | POA: Diagnosis not present

## 2019-06-19 ENCOUNTER — Other Ambulatory Visit: Payer: Self-pay | Admitting: Cardiology

## 2019-06-19 MED ORDER — METOPROLOL SUCCINATE ER 50 MG PO TB24: ORAL_TABLET | ORAL | 1 refills | Status: DC

## 2019-06-19 MED ORDER — PANTOPRAZOLE SODIUM 40 MG PO TBEC: 40.0000 mg | DELAYED_RELEASE_TABLET | Freq: Every day | ORAL | 1 refills | Status: DC

## 2019-06-19 NOTE — Telephone Encounter (Signed)
*  STAT* If patient is at the pharmacy, call can be transferred to refill team.   1. Which medications need to be refilled? pantoprazole (PROTONIX) 40 MG tablet metoprolol succinate (TOPROL-XL) 50 MG 24 hr tablet    2. Which pharmacy/location (including street and city if local pharmacy) is medication to be sent to? [facility] on [address] in [place], [place]   3. Do they need a 30 day or 90 day supply?

## 2019-06-22 ENCOUNTER — Telehealth: Payer: Self-pay | Admitting: Cardiology

## 2019-06-22 MED ORDER — METOPROLOL SUCCINATE ER 50 MG PO TB24: 25.0000 mg | ORAL_TABLET | Freq: Every day | ORAL | 1 refills | Status: DC

## 2019-06-22 NOTE — Telephone Encounter (Signed)
Patient called requesting that we contact his pharmacy in regards to the refill on Metoprolol. States that pharmacy told him that they had re faxed information to our office in regards to medication.

## 2019-07-14 DIAGNOSIS — I10 Essential (primary) hypertension: Secondary | ICD-10-CM | POA: Diagnosis not present

## 2019-07-14 DIAGNOSIS — G894 Chronic pain syndrome: Secondary | ICD-10-CM | POA: Diagnosis not present

## 2019-07-14 DIAGNOSIS — E669 Obesity, unspecified: Secondary | ICD-10-CM | POA: Diagnosis not present

## 2019-07-14 DIAGNOSIS — M1991 Primary osteoarthritis, unspecified site: Secondary | ICD-10-CM | POA: Diagnosis not present

## 2019-09-08 ENCOUNTER — Telehealth (INDEPENDENT_AMBULATORY_CARE_PROVIDER_SITE_OTHER): Payer: PPO | Admitting: Cardiology

## 2019-09-08 ENCOUNTER — Encounter: Payer: Self-pay | Admitting: *Deleted

## 2019-09-08 ENCOUNTER — Other Ambulatory Visit: Payer: Self-pay

## 2019-09-08 ENCOUNTER — Encounter: Payer: Self-pay | Admitting: Cardiology

## 2019-09-08 VITALS — Ht 64.0 in | Wt 215.0 lb

## 2019-09-08 DIAGNOSIS — E782 Mixed hyperlipidemia: Secondary | ICD-10-CM

## 2019-09-08 DIAGNOSIS — I10 Essential (primary) hypertension: Secondary | ICD-10-CM

## 2019-09-08 DIAGNOSIS — I2510 Atherosclerotic heart disease of native coronary artery without angina pectoris: Secondary | ICD-10-CM | POA: Diagnosis not present

## 2019-09-08 NOTE — Patient Instructions (Signed)

## 2019-09-08 NOTE — Progress Notes (Signed)
Virtual Visit via Telephone Note   This visit type was conducted due to national recommendations for restrictions regarding the COVID-19 Pandemic (e.g. social distancing) in an effort to limit this patient's exposure and mitigate transmission in our community.  Due to his co-morbid illnesses, this patient is at least at moderate risk for complications without adequate follow up.  This format is felt to be most appropriate for this patient at this time.  The patient did not have access to video technology/had technical difficulties with video requiring transitioning to audio format only (telephone).  All issues noted in this document were discussed and addressed.  No physical exam could be performed with this format.  Please refer to the patient's chart for his  consent to telehealth for [facility].   The patient was identified using 2 identifiers.  Date:  09/08/2019   ID:  [name], DOB 1950-03-14, MRN [id number]  Patient Location: Home Provider Location: Office/Clinic  PCP:  Elfredia Nevins, MD  Cardiologist:  Dina Rich, MD  Electrophysiologist:  None   Evaluation Performed:  Follow-Up Visit  Chief Complaint:  Follow up  History of Present Illness:    [name] is a 69 y.o. male seen today for follow up of the following medical problems.  1.CAD - hx of CABG 05/2011 (LIMA-LAD) - echo 05/2011 LVEF >55%, aortic sclerosis, mild MR,    No recent chest. No sob/doe - compliant with meds - he stopped ASA on his own  2. Hyperlipidemia  - labs followed by pcp - compliant with statin  3. HTN - compliant with meds  4. Carotid stenosis 07/2018 carotid mild bilateral disease - no neuro symptoms   5. AAA screen - 05/2016 AAA [place] normal      SH: has been vaccinated      The patient does not have symptoms concerning for COVID-19 infection (fever, chills, cough, or new shortness of breath).    Past Medical History:  Diagnosis Date  .  Abnormal nuclear stress test 05/28/2011  . Angina at rest [facility]) 05/28/2011  . Arrhythmia    pt stated "it's ok cause no one is concerned about it"  . Arthritis   . Chest pain   . Coronary artery disease   . Depression   . Dyslipidemia 05/28/2011  . Hypertension   . Smoker   . Tobacco abuse 05/28/2011   Past Surgical History:  Procedure Laterality Date  . CARDIAC CATHETERIZATION    . CERVICAL DISC SURGERY    . CORONARY ARTERY BYPASS GRAFT  05/27/2011   Procedure: OFF PUMP CORONARY ARTERY BYPASS GRAFTING (CABG);  Surgeon: Loreli Slot, MD;  Location: [facility] OR;  Service: Open Heart Surgery;  Laterality: N/A;  . KNEE ARTHROSCOPY W/ DEBRIDEMENT    . LEFT HEART CATHETERIZATION WITH CORONARY ANGIOGRAM N/A 05/26/2011   Procedure: LEFT HEART CATHETERIZATION WITH CORONARY ANGIOGRAM;  Surgeon: Chrystie Nose, MD;  Location[facility] CATH LAB;  Service: Cardiovascular;  Laterality: N/A;  . OTHER SURGICAL HISTORY  2018   shoulder (right)      No outpatient medications have been marked as taking for the 09/08/19 encounter (Appointment) with Antoine Poche, MD.     Allergies:   Penicillins and Ramipril   Social History   Tobacco Use  . Smoking status: Current Every Day Smoker    Packs/day: 0.50    Years: 35.00    Pack years: 17.50    Types: Cigarettes    Start date: 10/06/1971  . Smokeless tobacco: Never Used  Vaping Use  . Vaping Use: Never used  Substance Use Topics  . Alcohol use: No    Alcohol/week: 0.0 standard drinks  . Drug use: No     Family Hx: The patient's family history includes Heart disease in his brother; Hypertension in his mother; Kidney disease in his brother and mother; Stroke in his father; Valvular heart disease (age of onset: 69) in his unknown relative.  ROS:   Please see the history of present illness.     All other systems reviewed and are negative.   Prior CV studies:   The following studies were reviewed today:  05/2011  Cath Hemodynamics: Central Aortic Pressure / Mean Aortic Pressure: 120/76 LV Pressure / LV End diastolic Pressure: 20  Left Ventriculography: EF: 60-65% Wall Motion: No focal wall motion abnormalities  Coronary Angiographic Data:  Left Main:Short left main. Distal 20% stenosis into the ostial LAD.  Left Anterior Descending (LAD): There is a 90% ostial discrete lesion of the LAD. There is mild distal LAD disease.  1st diagonal (D1): Large vessel without significant stenosis.  Circumflex (LCx):Dominant vessel. There is a moderate stenosis of the LCX up to 50% distal to OM3.  1st obtuse marginal:Small vessel, high takeoff. No significant disease.  2nd obtuse marginal: Small vessel, no significant disease 3rd obtuse marginal: Large vessel which branches into 2 vessels supplying a good portion of the lateral wall. No significant stenoses.  posterior lateral [place]: Supplies the septum.  Right Coronary Artery: Small to moderate-sized non-dominant vessel, no significant stenoses.  Impression: 1. High risk ostial LAD discrete stenosis (90%). 2. Moderate mid-distal LCx stenosis. Left dominant system. 3. LVEF >60%, no focal wall motion abnormalities.  Plan: 1. Discussed case with Dr. Allyson Sabal and Dr. Alanda Amass. Stent would be high risk. Will ask CT surgery to evaluate for possible OPCABG. 2. Will admit due to high risk anatomy with hopefully surgical intervention in the next few days. He has not been on dual antiplatelets.  The case and results was discussed with the patient and family. The case and results was not discussed with the patient's PCP. The case and results was discussed with the patient's Cardiologist.  04/2016 AAA [place] No aneurysm  05/2016 carotid US 40-59% bilateral ICA stenosis  Labs/Other Tests and Data Reviewed:    EKG:  n/a  Recent Labs: No results found for requested labs within last  8760 hours.   Recent Lipid Panel Lab Results  Component Value Date/Time   CHOL 136 11/03/2013 09:00 AM   TRIG 74 11/03/2013 09:00 AM   HDL 47 11/03/2013 09:00 AM   CHOLHDL 2.9 11/03/2013 09:00 AM   LDLCALC 74 11/03/2013 09:00 AM    Wt Readings from Last 3 Encounters:  08/31/18 220 lb (99.8 kg)  06/01/17 224 lb (101.6 kg)  05/22/16 220 lb 12.8 oz (100.2 kg)     Objective:    Vital Signs:   Today's Vitals   09/08/19 1157  Weight: 215 lb (97.5 kg)  Height: 5\' 4"  (1.626 m)   Body mass index is 36.9 kg/m. Normal affect. NOrmal speech pattern and tone. COmfortable, no apparent distress. No audible signs of sob or wheezing.   ASSESSMENT & PLAN:    1. CAD -no symptoms, continue current meds  2. HTN - continue current meds  3. Hyperlipidemia -he will continue statin, request labs from pcp    COVID-19 Education: The signs and symptoms of COVID-19 were discussed with the patient and how to seek care for testing (follow up with  PCP or arrange E-visit).  The importance of social distancing was discussed today.  Time:   Today, I have spent 22 minutes with the patient with telehealth technology discussing the above problems.     Medication Adjustments/Labs and Tests Ordered: Current medicines are reviewed at length with the patient today.  Concerns regarding medicines are outlined above.   Tests Ordered: No orders of the defined types were placed in this encounter.   Medication Changes: No orders of the defined types were placed in this encounter.   Follow Up:  In Person in 1 year(s)  Signed, Dina Rich, MD  09/08/2019 9:49 AM    [facility]

## 2019-09-12 DIAGNOSIS — E7849 Other hyperlipidemia: Secondary | ICD-10-CM | POA: Diagnosis not present

## 2019-09-12 DIAGNOSIS — G894 Chronic pain syndrome: Secondary | ICD-10-CM | POA: Diagnosis not present

## 2019-09-12 DIAGNOSIS — I10 Essential (primary) hypertension: Secondary | ICD-10-CM | POA: Diagnosis not present

## 2019-09-12 DIAGNOSIS — N400 Enlarged prostate without lower urinary tract symptoms: Secondary | ICD-10-CM | POA: Diagnosis not present

## 2019-09-23 ENCOUNTER — Other Ambulatory Visit: Payer: Self-pay | Admitting: Cardiology

## 2019-10-02 ENCOUNTER — Ambulatory Visit (HOSPITAL_COMMUNITY)
Admission: RE | Admit: 2019-10-02 | Discharge: 2019-10-02 | Disposition: A | Discharge status: Home / Self Care | Payer: PPO | Source: Ambulatory Visit | Attending: Internal Medicine | Admitting: Internal Medicine

## 2019-10-02 ENCOUNTER — Other Ambulatory Visit: Payer: Self-pay

## 2019-10-02 ENCOUNTER — Other Ambulatory Visit (HOSPITAL_COMMUNITY): Payer: Self-pay | Admitting: Internal Medicine

## 2019-10-02 DIAGNOSIS — M1991 Primary osteoarthritis, unspecified site: Secondary | ICD-10-CM | POA: Diagnosis not present

## 2019-10-02 DIAGNOSIS — Z6837 Body mass index (BMI) 37.0-37.9, adult: Secondary | ICD-10-CM | POA: Diagnosis not present

## 2019-10-02 DIAGNOSIS — I10 Essential (primary) hypertension: Secondary | ICD-10-CM | POA: Diagnosis not present

## 2019-10-02 DIAGNOSIS — G894 Chronic pain syndrome: Secondary | ICD-10-CM | POA: Diagnosis not present

## 2019-10-02 DIAGNOSIS — M79671 Pain in right foot: Secondary | ICD-10-CM | POA: Diagnosis not present

## 2019-10-02 DIAGNOSIS — E063 Autoimmune thyroiditis: Secondary | ICD-10-CM | POA: Diagnosis not present

## 2019-11-03 DIAGNOSIS — G5761 Lesion of plantar nerve, right lower limb: Secondary | ICD-10-CM | POA: Diagnosis not present

## 2019-11-03 DIAGNOSIS — M722 Plantar fascial fibromatosis: Secondary | ICD-10-CM | POA: Diagnosis not present

## 2019-11-03 DIAGNOSIS — M79671 Pain in right foot: Secondary | ICD-10-CM | POA: Diagnosis not present

## 2019-11-16 DIAGNOSIS — G894 Chronic pain syndrome: Secondary | ICD-10-CM | POA: Diagnosis not present

## 2019-11-16 DIAGNOSIS — E669 Obesity, unspecified: Secondary | ICD-10-CM | POA: Diagnosis not present

## 2019-11-16 DIAGNOSIS — I10 Essential (primary) hypertension: Secondary | ICD-10-CM | POA: Diagnosis not present

## 2019-11-16 DIAGNOSIS — M1991 Primary osteoarthritis, unspecified site: Secondary | ICD-10-CM | POA: Diagnosis not present

## 2019-11-24 DIAGNOSIS — G5761 Lesion of plantar nerve, right lower limb: Secondary | ICD-10-CM | POA: Diagnosis not present

## 2019-11-24 DIAGNOSIS — M722 Plantar fascial fibromatosis: Secondary | ICD-10-CM | POA: Diagnosis not present

## 2019-11-24 DIAGNOSIS — M79671 Pain in right foot: Secondary | ICD-10-CM | POA: Diagnosis not present

## 2019-12-12 DIAGNOSIS — G894 Chronic pain syndrome: Secondary | ICD-10-CM | POA: Diagnosis not present

## 2019-12-12 DIAGNOSIS — E669 Obesity, unspecified: Secondary | ICD-10-CM | POA: Diagnosis not present

## 2019-12-12 DIAGNOSIS — M1991 Primary osteoarthritis, unspecified site: Secondary | ICD-10-CM | POA: Diagnosis not present

## 2019-12-12 DIAGNOSIS — K219 Gastro-esophageal reflux disease without esophagitis: Secondary | ICD-10-CM | POA: Diagnosis not present

## 2020-01-15 DIAGNOSIS — G894 Chronic pain syndrome: Secondary | ICD-10-CM | POA: Diagnosis not present

## 2020-01-15 DIAGNOSIS — Z6837 Body mass index (BMI) 37.0-37.9, adult: Secondary | ICD-10-CM | POA: Diagnosis not present

## 2020-01-15 DIAGNOSIS — K219 Gastro-esophageal reflux disease without esophagitis: Secondary | ICD-10-CM | POA: Diagnosis not present

## 2020-01-15 DIAGNOSIS — I10 Essential (primary) hypertension: Secondary | ICD-10-CM | POA: Diagnosis not present

## 2020-01-23 DIAGNOSIS — Z23 Encounter for immunization: Secondary | ICD-10-CM | POA: Diagnosis not present

## 2020-01-31 DIAGNOSIS — Z1212 Encounter for screening for malignant neoplasm of rectum: Secondary | ICD-10-CM | POA: Diagnosis not present

## 2020-01-31 DIAGNOSIS — Z1211 Encounter for screening for malignant neoplasm of colon: Secondary | ICD-10-CM | POA: Diagnosis not present

## 2020-02-08 LAB — EXTERNAL GENERIC LAB PROCEDURE: COLOGUARD: NEGATIVE

## 2020-02-08 LAB — COLOGUARD: COLOGUARD: NEGATIVE

## 2020-02-14 ENCOUNTER — Other Ambulatory Visit: Payer: Self-pay | Admitting: Cardiology

## 2020-02-19 DIAGNOSIS — M1991 Primary osteoarthritis, unspecified site: Secondary | ICD-10-CM | POA: Diagnosis not present

## 2020-02-19 DIAGNOSIS — G894 Chronic pain syndrome: Secondary | ICD-10-CM | POA: Diagnosis not present

## 2020-03-19 DIAGNOSIS — I10 Essential (primary) hypertension: Secondary | ICD-10-CM | POA: Diagnosis not present

## 2020-03-19 DIAGNOSIS — G894 Chronic pain syndrome: Secondary | ICD-10-CM | POA: Diagnosis not present

## 2020-03-19 DIAGNOSIS — M1991 Primary osteoarthritis, unspecified site: Secondary | ICD-10-CM | POA: Diagnosis not present

## 2020-03-21 ENCOUNTER — Other Ambulatory Visit: Payer: Self-pay | Admitting: Cardiology

## 2020-04-22 DIAGNOSIS — Z1331 Encounter for screening for depression: Secondary | ICD-10-CM | POA: Diagnosis not present

## 2020-04-22 DIAGNOSIS — Z6838 Body mass index (BMI) 38.0-38.9, adult: Secondary | ICD-10-CM | POA: Diagnosis not present

## 2020-04-22 DIAGNOSIS — G894 Chronic pain syndrome: Secondary | ICD-10-CM | POA: Diagnosis not present

## 2020-04-22 DIAGNOSIS — E6609 Other obesity due to excess calories: Secondary | ICD-10-CM | POA: Diagnosis not present

## 2020-04-22 DIAGNOSIS — I10 Essential (primary) hypertension: Secondary | ICD-10-CM | POA: Diagnosis not present

## 2020-04-22 DIAGNOSIS — Z0000 Encounter for general adult medical examination without abnormal findings: Secondary | ICD-10-CM | POA: Diagnosis not present

## 2020-04-22 DIAGNOSIS — N400 Enlarged prostate without lower urinary tract symptoms: Secondary | ICD-10-CM | POA: Diagnosis not present

## 2020-04-24 DIAGNOSIS — Z0000 Encounter for general adult medical examination without abnormal findings: Secondary | ICD-10-CM | POA: Diagnosis not present

## 2020-04-24 DIAGNOSIS — Z1389 Encounter for screening for other disorder: Secondary | ICD-10-CM | POA: Diagnosis not present

## 2020-04-24 DIAGNOSIS — Z6838 Body mass index (BMI) 38.0-38.9, adult: Secondary | ICD-10-CM | POA: Diagnosis not present

## 2020-04-24 DIAGNOSIS — E7849 Other hyperlipidemia: Secondary | ICD-10-CM | POA: Diagnosis not present

## 2020-04-25 ENCOUNTER — Other Ambulatory Visit: Payer: Self-pay | Admitting: *Deleted

## 2020-04-25 DIAGNOSIS — I2510 Atherosclerotic heart disease of native coronary artery without angina pectoris: Secondary | ICD-10-CM

## 2020-05-20 DIAGNOSIS — I10 Essential (primary) hypertension: Secondary | ICD-10-CM | POA: Diagnosis not present

## 2020-05-20 DIAGNOSIS — M1991 Primary osteoarthritis, unspecified site: Secondary | ICD-10-CM | POA: Diagnosis not present

## 2020-05-20 DIAGNOSIS — G894 Chronic pain syndrome: Secondary | ICD-10-CM | POA: Diagnosis not present

## 2020-06-13 DIAGNOSIS — G894 Chronic pain syndrome: Secondary | ICD-10-CM | POA: Diagnosis not present

## 2020-06-13 DIAGNOSIS — E669 Obesity, unspecified: Secondary | ICD-10-CM | POA: Diagnosis not present

## 2020-06-13 DIAGNOSIS — M1991 Primary osteoarthritis, unspecified site: Secondary | ICD-10-CM | POA: Diagnosis not present

## 2020-06-19 ENCOUNTER — Other Ambulatory Visit: Payer: Self-pay | Admitting: Cardiology

## 2020-07-15 DIAGNOSIS — G894 Chronic pain syndrome: Secondary | ICD-10-CM | POA: Diagnosis not present

## 2020-07-15 DIAGNOSIS — G4700 Insomnia, unspecified: Secondary | ICD-10-CM | POA: Diagnosis not present

## 2020-07-15 DIAGNOSIS — I10 Essential (primary) hypertension: Secondary | ICD-10-CM | POA: Diagnosis not present

## 2020-07-15 DIAGNOSIS — Z6837 Body mass index (BMI) 37.0-37.9, adult: Secondary | ICD-10-CM | POA: Diagnosis not present

## 2020-07-15 DIAGNOSIS — M1991 Primary osteoarthritis, unspecified site: Secondary | ICD-10-CM | POA: Diagnosis not present

## 2020-08-16 DIAGNOSIS — I10 Essential (primary) hypertension: Secondary | ICD-10-CM | POA: Diagnosis not present

## 2020-08-16 DIAGNOSIS — G894 Chronic pain syndrome: Secondary | ICD-10-CM | POA: Diagnosis not present

## 2020-09-16 DIAGNOSIS — I10 Essential (primary) hypertension: Secondary | ICD-10-CM | POA: Diagnosis not present

## 2020-09-16 DIAGNOSIS — G894 Chronic pain syndrome: Secondary | ICD-10-CM | POA: Diagnosis not present

## 2020-09-16 DIAGNOSIS — N400 Enlarged prostate without lower urinary tract symptoms: Secondary | ICD-10-CM | POA: Diagnosis not present

## 2020-09-17 ENCOUNTER — Other Ambulatory Visit: Payer: Self-pay | Admitting: Cardiology

## 2020-10-17 ENCOUNTER — Other Ambulatory Visit: Payer: Self-pay | Admitting: Cardiology

## 2020-10-21 IMAGING — DX DG FOOT COMPLETE 3+V*R*
3 series · 3 of 3 positions shown · non-contrast
Comparison: None.

CLINICAL DATA: Right foot pain, no known injury, initial encounter

EXAM:
RIGHT FOOT COMPLETE - 3+ VIEW

[foot ap]
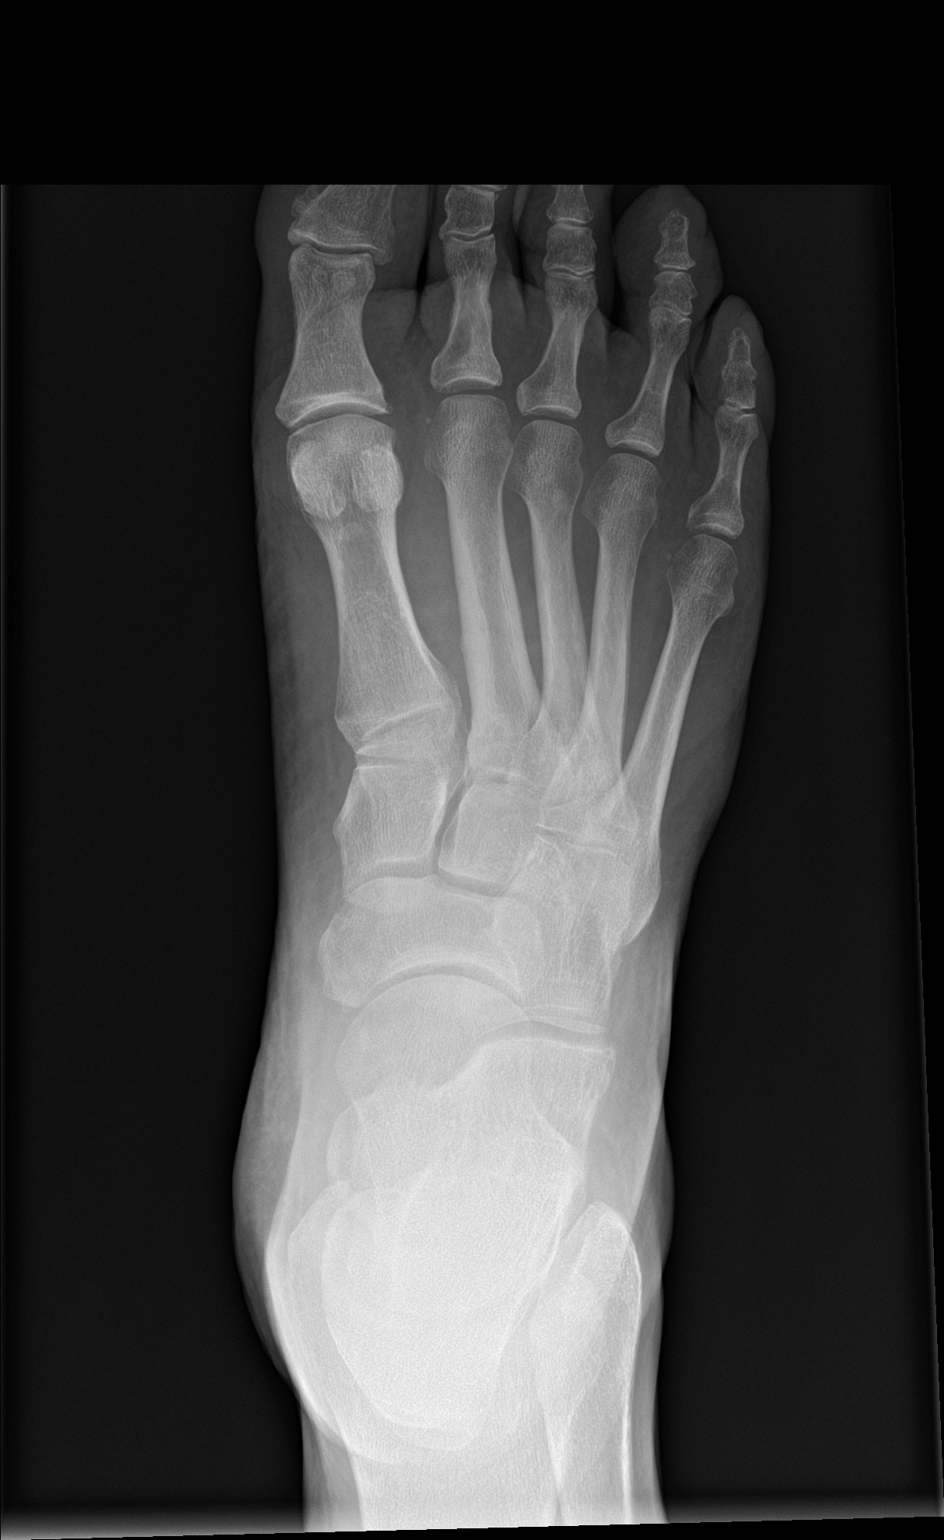

[foot obl]
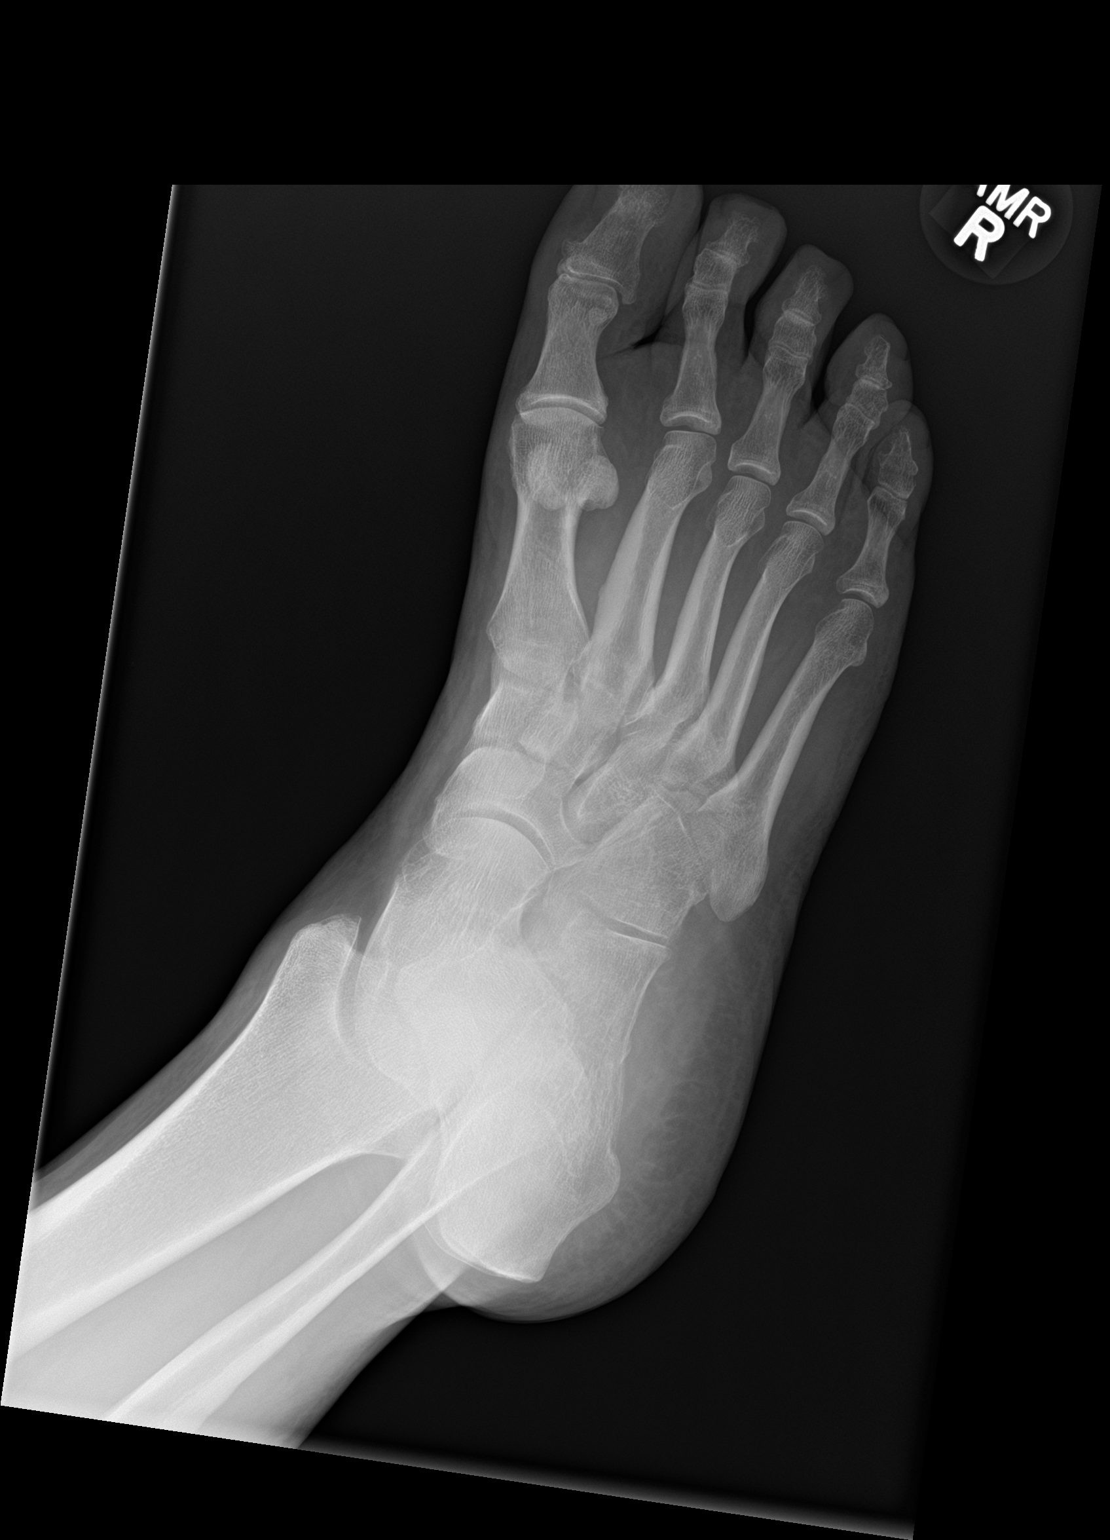

[foot lat]
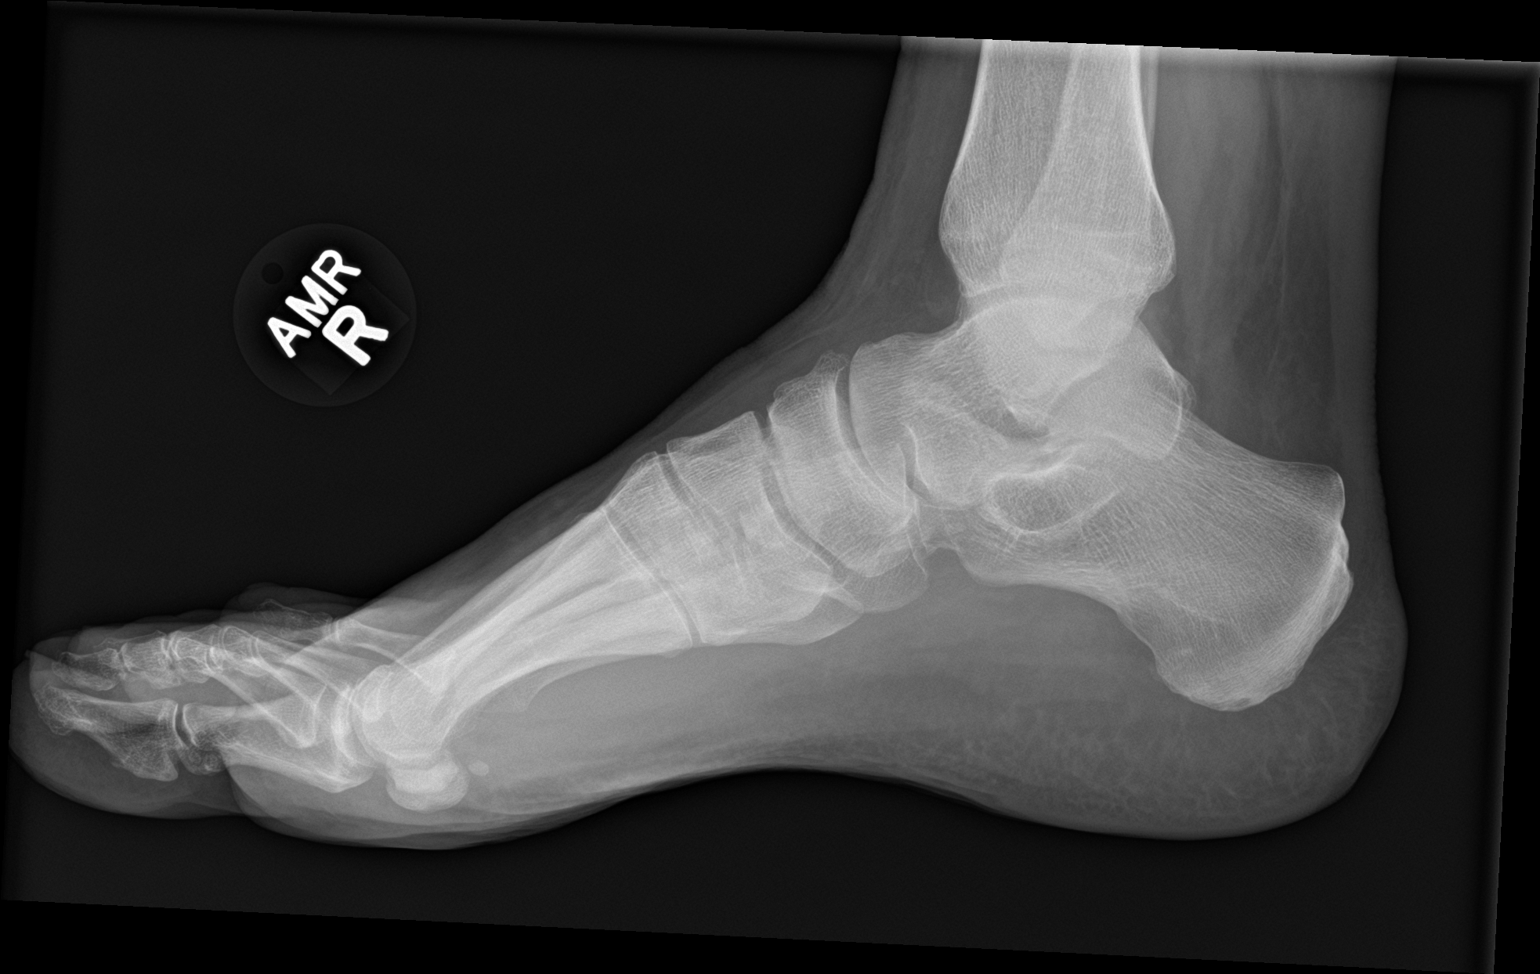

[3 of 3 positions shown; findings below may reference images not displayed]

FINDINGS: No acute fracture or dislocation is noted. No soft tissue
abnormality is seen.
IMPRESSION: No acute abnormality noted.

## 2020-11-18 ENCOUNTER — Other Ambulatory Visit: Payer: Self-pay | Admitting: Cardiology

## 2020-11-18 DIAGNOSIS — E063 Autoimmune thyroiditis: Secondary | ICD-10-CM | POA: Diagnosis not present

## 2020-11-18 DIAGNOSIS — E6609 Other obesity due to excess calories: Secondary | ICD-10-CM | POA: Diagnosis not present

## 2020-11-18 DIAGNOSIS — Z6836 Body mass index (BMI) 36.0-36.9, adult: Secondary | ICD-10-CM | POA: Diagnosis not present

## 2020-11-18 DIAGNOSIS — I10 Essential (primary) hypertension: Secondary | ICD-10-CM | POA: Diagnosis not present

## 2020-11-18 DIAGNOSIS — G894 Chronic pain syndrome: Secondary | ICD-10-CM | POA: Diagnosis not present

## 2020-11-20 DIAGNOSIS — Z23 Encounter for immunization: Secondary | ICD-10-CM | POA: Diagnosis not present

## 2020-12-16 ENCOUNTER — Other Ambulatory Visit: Payer: Self-pay | Admitting: Cardiology

## 2020-12-17 ENCOUNTER — Other Ambulatory Visit: Payer: Self-pay | Admitting: Cardiology

## 2020-12-23 ENCOUNTER — Other Ambulatory Visit: Payer: Self-pay | Admitting: Cardiology

## 2021-01-10 DIAGNOSIS — E063 Autoimmune thyroiditis: Secondary | ICD-10-CM | POA: Diagnosis not present

## 2021-01-10 DIAGNOSIS — G894 Chronic pain syndrome: Secondary | ICD-10-CM | POA: Diagnosis not present

## 2021-01-10 DIAGNOSIS — I10 Essential (primary) hypertension: Secondary | ICD-10-CM | POA: Diagnosis not present

## 2021-01-15 ENCOUNTER — Other Ambulatory Visit: Payer: Self-pay | Admitting: Cardiology

## 2021-02-13 DIAGNOSIS — E063 Autoimmune thyroiditis: Secondary | ICD-10-CM | POA: Diagnosis not present

## 2021-02-13 DIAGNOSIS — K219 Gastro-esophageal reflux disease without esophagitis: Secondary | ICD-10-CM | POA: Diagnosis not present

## 2021-02-13 DIAGNOSIS — M159 Polyosteoarthritis, unspecified: Secondary | ICD-10-CM | POA: Diagnosis not present

## 2021-02-13 DIAGNOSIS — Z23 Encounter for immunization: Secondary | ICD-10-CM | POA: Diagnosis not present

## 2021-02-13 DIAGNOSIS — E782 Mixed hyperlipidemia: Secondary | ICD-10-CM | POA: Diagnosis not present

## 2021-02-13 DIAGNOSIS — Z6837 Body mass index (BMI) 37.0-37.9, adult: Secondary | ICD-10-CM | POA: Diagnosis not present

## 2021-02-13 DIAGNOSIS — I2510 Atherosclerotic heart disease of native coronary artery without angina pectoris: Secondary | ICD-10-CM | POA: Diagnosis not present

## 2021-02-13 DIAGNOSIS — E6609 Other obesity due to excess calories: Secondary | ICD-10-CM | POA: Diagnosis not present

## 2021-02-14 ENCOUNTER — Other Ambulatory Visit: Payer: Self-pay | Admitting: Cardiology

## 2021-03-14 DIAGNOSIS — I10 Essential (primary) hypertension: Secondary | ICD-10-CM | POA: Diagnosis not present

## 2021-03-14 DIAGNOSIS — M159 Polyosteoarthritis, unspecified: Secondary | ICD-10-CM | POA: Diagnosis not present

## 2021-03-14 DIAGNOSIS — G894 Chronic pain syndrome: Secondary | ICD-10-CM | POA: Diagnosis not present

## 2021-03-14 DIAGNOSIS — E063 Autoimmune thyroiditis: Secondary | ICD-10-CM | POA: Diagnosis not present

## 2021-03-16 ENCOUNTER — Other Ambulatory Visit: Payer: Self-pay | Admitting: Cardiology

## 2021-05-13 DIAGNOSIS — M1991 Primary osteoarthritis, unspecified site: Secondary | ICD-10-CM | POA: Diagnosis not present

## 2021-05-13 DIAGNOSIS — I10 Essential (primary) hypertension: Secondary | ICD-10-CM | POA: Diagnosis not present

## 2021-05-13 DIAGNOSIS — M159 Polyosteoarthritis, unspecified: Secondary | ICD-10-CM | POA: Diagnosis not present

## 2021-05-13 DIAGNOSIS — Z6837 Body mass index (BMI) 37.0-37.9, adult: Secondary | ICD-10-CM | POA: Diagnosis not present

## 2021-05-13 DIAGNOSIS — Z1331 Encounter for screening for depression: Secondary | ICD-10-CM | POA: Diagnosis not present

## 2021-05-13 DIAGNOSIS — E6609 Other obesity due to excess calories: Secondary | ICD-10-CM | POA: Diagnosis not present

## 2021-05-13 DIAGNOSIS — E063 Autoimmune thyroiditis: Secondary | ICD-10-CM | POA: Diagnosis not present

## 2021-05-13 DIAGNOSIS — G894 Chronic pain syndrome: Secondary | ICD-10-CM | POA: Diagnosis not present

## 2021-05-13 DIAGNOSIS — Z0001 Encounter for general adult medical examination with abnormal findings: Secondary | ICD-10-CM | POA: Diagnosis not present

## 2021-05-13 DIAGNOSIS — N400 Enlarged prostate without lower urinary tract symptoms: Secondary | ICD-10-CM | POA: Diagnosis not present

## 2021-05-16 DIAGNOSIS — E063 Autoimmune thyroiditis: Secondary | ICD-10-CM | POA: Diagnosis not present

## 2021-05-16 DIAGNOSIS — I10 Essential (primary) hypertension: Secondary | ICD-10-CM | POA: Diagnosis not present

## 2021-05-16 DIAGNOSIS — Z0001 Encounter for general adult medical examination with abnormal findings: Secondary | ICD-10-CM | POA: Diagnosis not present

## 2021-05-27 ENCOUNTER — Encounter: Payer: Self-pay | Admitting: Cardiology

## 2021-06-24 ENCOUNTER — Encounter: Payer: Self-pay | Admitting: Cardiology

## 2021-06-24 ENCOUNTER — Ambulatory Visit: Payer: PPO | Admitting: Cardiology

## 2021-06-24 VITALS — BP 150/80 | HR 64 | Ht 64.0 in | Wt 216.8 lb

## 2021-06-24 DIAGNOSIS — I6523 Occlusion and stenosis of bilateral carotid arteries: Secondary | ICD-10-CM | POA: Diagnosis not present

## 2021-06-24 DIAGNOSIS — I2510 Atherosclerotic heart disease of native coronary artery without angina pectoris: Secondary | ICD-10-CM

## 2021-06-24 DIAGNOSIS — E782 Mixed hyperlipidemia: Secondary | ICD-10-CM | POA: Diagnosis not present

## 2021-06-24 DIAGNOSIS — I10 Essential (primary) hypertension: Secondary | ICD-10-CM | POA: Diagnosis not present

## 2021-06-24 MED ORDER — METOPROLOL SUCCINATE ER 25 MG PO TB24
12.5000 mg | ORAL_TABLET | Freq: Every day | ORAL | 3 refills | Status: DC
Start: 2021-06-24 — End: 2022-06-02

## 2021-06-24 MED ORDER — LOSARTAN POTASSIUM 25 MG PO TABS: 25.0000 mg | ORAL_TABLET | Freq: Every day | ORAL | 3 refills | Status: DC

## 2021-06-24 NOTE — Progress Notes (Signed)
? ? ? ?Clinical Summary ?[name] is a 71 y.o.male seen today for follow up of the following medical problems.  ?  ?1.CAD ?- hx of CABG 05/2011 (LIMA-LAD) ?- echo 05/2011 LVEF >55%, aortic sclerosis, mild MR,  ?  ?  ?- no chest pains, no SOB/DOE ?-  Reports ran out of toprol and losartan ? ? ?  ?2. Hyperlipidemia ?  ?- labs followed by pcp ?- compliant with statin ? ?04/2021 TC 131 TG 67 HDL 52 LDL 65 ?  ?3. HTN ?- home bp's 120s/70s ?  ?4. Carotid stenosis ?07/2018 carotid mild bilateral disease ? ?  ?  ?5. AAA screen ?- 05/2016 AAA [place] normal  ?  ?  ?  ? ? ?Past Medical History:  ?Diagnosis Date  ? Abnormal nuclear stress test 05/28/2011  ? Angina at rest [facility]) 05/28/2011  ? Arrhythmia   ? pt stated "it's ok cause no one is concerned about it"  ? Arthritis   ? Chest pain   ? Coronary artery disease   ? Depression   ? Dyslipidemia 05/28/2011  ? Hypertension   ? Smoker   ? Tobacco abuse 05/28/2011  ? ? ? ?Allergies  ?Allergen Reactions  ? Penicillins Other (See Comments)  ?  unknown  ? Ramipril Cough  ? ? ? ?Current Outpatient Medications  ?Medication Sig Dispense Refill  ? atorvastatin (LIPITOR) 80 MG tablet Take 1 tablet (80 mg total) by mouth daily. Patient has been given multiple refills with note to call and schedule an appointment. Must call the office and schedule for further refills to be granted 7 tablet 0  ? furosemide (LASIX) 20 MG tablet Take 20 mg by mouth daily as needed.    ? ibuprofen (ADVIL,MOTRIN) 800 MG tablet Take 800 mg by mouth daily.    ? losartan (COZAAR) 50 MG tablet TAKE 1 TABLET BY MOUTH EVERY DAY 90 tablet 3  ? metoprolol succinate (TOPROL-XL) 50 MG 24 hr tablet TAKE 1/2 TABLET(25 MG) BY MOUTH DAILY WITH OR IMMEDIATELY FOLLOWING A MEAL 15 tablet 0  ? oxyCODONE (ROXICODONE) 15 MG immediate release tablet Take 15 mg by mouth every 4 (four) hours as needed for pain.    ? pantoprazole (PROTONIX) 40 MG tablet TAKE 1 TABLET(40 MG) BY MOUTH DAILY 30 tablet 0  ? tamsulosin (FLOMAX) 0.4 MG CAPS  capsule Take 0.4 mg by mouth daily.    ? ?No current facility-administered medications for this visit.  ? ? ? ?Past Surgical History:  ?Procedure Laterality Date  ? CARDIAC CATHETERIZATION    ? CERVICAL DISC SURGERY    ? CORONARY ARTERY BYPASS GRAFT  05/27/2011  ? Procedure: OFF PUMP CORONARY ARTERY BYPASS GRAFTING (CABG);  Surgeon: Loreli Slot, MD;  Location: [facility] OR;  Service: Open Heart Surgery;  Laterality: N/A;  ? KNEE ARTHROSCOPY W/ DEBRIDEMENT    ? LEFT HEART CATHETERIZATION WITH CORONARY ANGIOGRAM N/A 05/26/2011  ? Procedure: LEFT HEART CATHETERIZATION WITH CORONARY ANGIOGRAM;  Surgeon: Chrystie Nose, MD;  Location: [facility] CATH LAB;  Service: Cardiovascular;  Laterality: N/A;  ? OTHER SURGICAL HISTORY  2018  ? shoulder (right)   ? ? ? ?Allergies  ?Allergen Reactions  ? Penicillins Other (See Comments)  ?  unknown  ? Ramipril Cough  ? ? ? ? ?Family History  ?Problem Relation Age of Onset  ? Stroke Father   ? Hypertension Mother   ? Kidney disease Mother   ? Heart disease Brother   ? Kidney disease Brother   ?  transplant  ? Valvular heart disease Unknown 70  ?     congenital/had sugery for mechanical valve installed  ? ? ? ?Social History ?[name] reports that he has been smoking cigarettes. He started smoking about 49 years ago. He has a 17.50 pack-year smoking history. He has never used smokeless tobacco. ?[name] reports no history of alcohol use. ? ? ?Review of Systems ?CONSTITUTIONAL: No weight loss, fever, chills, weakness or fatigue.  ?HEENT: Eyes: No visual loss, blurred vision, double vision or yellow sclerae.No hearing loss, sneezing, congestion, runny nose or sore throat.  ?SKIN: No rash or itching.  ?CARDIOVASCULAR: per hpi ?RESPIRATORY: No shortness of breath, cough or sputum.  ?GASTROINTESTINAL: No anorexia, nausea, vomiting or diarrhea. No abdominal pain or blood.  ?GENITOURINARY: No burning on urination, no polyuria ?NEUROLOGICAL: No headache, dizziness, syncope,  paralysis, ataxia, numbness or tingling in the extremities. No change in bowel or bladder control.  ?MUSCULOSKELETAL: No muscle, back pain, joint pain or stiffness.  ?LYMPHATICS: No enlarged nodes. No history of splenectomy.  ?PSYCHIATRIC: No history of depression or anxiety.  ?ENDOCRINOLOGIC: No reports of sweating, cold or heat intolerance. No polyuria or polydipsia.  ?. ? ? ?Physical Examination ?Today's Vitals  ? 06/24/21 0939  ?BP: (!) 150/80  ?Pulse: 64  ?SpO2: 97%  ?Weight: 216 lb 12.8 oz (98.3 kg)  ?Height: 5\' 4"  (1.626 m)  ? ?Body mass index is 37.21 kg/m?. ? ?Gen: resting comfortably, no acute distress ?HEENT: no scleral icterus, pupils equal round and reactive, no palptable cervical adenopathy,  ?CV: RRR, no m/r/g no jvd ?Resp: Clear to auscultation bilaterally ?GI: abdomen is soft, non-tender, non-distended, normal bowel sounds, no hepatosplenomegaly ?MSK: extremities are warm, no edema.  ?Skin: warm, no rash ?Neuro:  no focal deficits ?Psych: appropriate affect ? ? ?Diagnostic Studies ? ?05/2011 Cath ?Hemodynamics: ?            Central Aortic Pressure / Mean Aortic Pressure: 120/76 ?            LV Pressure / LV End diastolic Pressure:  20 ?  ?Left Ventriculography: ?            EF:  60-65% ?            Wall Motion: No focal wall motion abnormalities ?  ?Coronary Angiographic Data: ?Left Main:  Short left main. Distal 20% stenosis into the ostial LAD. ?Left Anterior Descending (LAD):  There is a 90% ostial discrete lesion of the LAD. There is mild distal LAD disease. ?1st diagonal (D1):  Large vessel without significant stenosis. ?Circumflex (LCx):  Dominant vessel. There is a moderate stenosis of the LCX up to 50% distal to OM3. ?1st obtuse marginal:  Small vessel, high takeoff. No significant disease. ?2nd obtuse marginal: Small vessel, no significant disease ?3rd obtuse marginal:  Large vessel which branches into 2 vessels supplying a good portion of the lateral wall. No significant stenoses.        ?posterior lateral Genene Kilman:  Supplies the septum. ?Right Coronary Artery: Small to moderate-sized non-dominant vessel, no significant stenoses. ?  ?Impression: ?1.  High risk ostial LAD discrete stenosis (90%). ?2.  Moderate mid-distal LCx stenosis. Left dominant system. ?3.  LVEF >60%, no focal wall motion abnormalities. ?  ?Plan: ?1.  Discussed case with Dr. Allyson SabalBerry and Dr. Alanda AmassWeintraub. Stent would be high risk. Will ask CT surgery to evaluate for possible OPCABG. ?2.  Will admit due to high risk anatomy with hopefully surgical intervention in the next few  days. He has not been on dual antiplatelets. ?  ?The case and results was discussed with the patient and family. ?The case and results was not discussed with the patient's PCP. ?The case and results was discussed with the patient's Cardiologist. ?  ?04/2016 AAA [place] ?No aneurysm ?  ?05/2016 carotid US ?40-59% bilateral ICA stenosis ? ? ?Assessment and Plan  ?1. CAD ?- no symptoms, continue current meds ?- restart losartan at 25mg  daily, toprol at 12.5mg  daily.  ?- EKG today SR, PACs, no ischemic changes  ?2. HTN ?- elevated here but home numbers at goal, restarting losartan and toprol as listed above ? ?  ?3. Hyperlipidemia ?-at goal, continue current meds ? ? ?F/u 1 year ? ? ? ? ? ? , M.D ?

## 2021-06-24 NOTE — Patient Instructions (Signed)
Medication Instructions:  ?Your physician has recommended you make the following change in your medication:  ?Start losartan 25 mg once a day ?Start metoprolol 12.5 mg once a day ?Continue all other medications as directed. ? ?Labwork: ?none ? ?Testing/Procedures: ?Your physician has requested that you have a carotid duplex. This test is an ultrasound of the carotid arteries in your neck. It looks at blood flow through these arteries that supply the brain with blood. Allow one hour for this exam. There are no restrictions or special instructions.  ? ?Follow-Up: ?Your physician recommends that you schedule a follow-up appointment in: 1 year ? ?Any Other Special Instructions Will Be Listed Below (If Applicable). ? ?If you need a refill on your cardiac medications before your next appointment, please call your pharmacy. ? ?

## 2021-08-04 DIAGNOSIS — E063 Autoimmune thyroiditis: Secondary | ICD-10-CM | POA: Diagnosis not present

## 2021-08-04 DIAGNOSIS — M159 Polyosteoarthritis, unspecified: Secondary | ICD-10-CM | POA: Diagnosis not present

## 2021-08-04 DIAGNOSIS — G894 Chronic pain syndrome: Secondary | ICD-10-CM | POA: Diagnosis not present

## 2021-08-04 DIAGNOSIS — I10 Essential (primary) hypertension: Secondary | ICD-10-CM | POA: Diagnosis not present

## 2021-08-06 ENCOUNTER — Ambulatory Visit (INDEPENDENT_AMBULATORY_CARE_PROVIDER_SITE_OTHER): Payer: PPO

## 2021-08-06 DIAGNOSIS — I6523 Occlusion and stenosis of bilateral carotid arteries: Secondary | ICD-10-CM

## 2021-09-03 DIAGNOSIS — L821 Other seborrheic keratosis: Secondary | ICD-10-CM | POA: Diagnosis not present

## 2021-09-03 DIAGNOSIS — I208 Other forms of angina pectoris: Secondary | ICD-10-CM | POA: Diagnosis not present

## 2021-09-03 DIAGNOSIS — Z0001 Encounter for general adult medical examination with abnormal findings: Secondary | ICD-10-CM | POA: Diagnosis not present

## 2021-09-03 DIAGNOSIS — G894 Chronic pain syndrome: Secondary | ICD-10-CM | POA: Diagnosis not present

## 2021-09-03 DIAGNOSIS — R5383 Other fatigue: Secondary | ICD-10-CM | POA: Diagnosis not present

## 2021-09-03 DIAGNOSIS — E063 Autoimmune thyroiditis: Secondary | ICD-10-CM | POA: Diagnosis not present

## 2021-09-03 DIAGNOSIS — E6609 Other obesity due to excess calories: Secondary | ICD-10-CM | POA: Diagnosis not present

## 2021-09-03 DIAGNOSIS — Z6837 Body mass index (BMI) 37.0-37.9, adult: Secondary | ICD-10-CM | POA: Diagnosis not present

## 2021-09-08 DIAGNOSIS — E291 Testicular hypofunction: Secondary | ICD-10-CM | POA: Diagnosis not present

## 2021-10-10 DIAGNOSIS — E291 Testicular hypofunction: Secondary | ICD-10-CM | POA: Diagnosis not present

## 2021-11-10 DIAGNOSIS — Z23 Encounter for immunization: Secondary | ICD-10-CM | POA: Diagnosis not present

## 2021-11-10 DIAGNOSIS — E291 Testicular hypofunction: Secondary | ICD-10-CM | POA: Diagnosis not present

## 2021-12-01 DIAGNOSIS — G894 Chronic pain syndrome: Secondary | ICD-10-CM | POA: Diagnosis not present

## 2021-12-01 DIAGNOSIS — E063 Autoimmune thyroiditis: Secondary | ICD-10-CM | POA: Diagnosis not present

## 2021-12-01 DIAGNOSIS — I872 Venous insufficiency (chronic) (peripheral): Secondary | ICD-10-CM | POA: Diagnosis not present

## 2021-12-01 DIAGNOSIS — N400 Enlarged prostate without lower urinary tract symptoms: Secondary | ICD-10-CM | POA: Diagnosis not present

## 2021-12-01 DIAGNOSIS — Z6838 Body mass index (BMI) 38.0-38.9, adult: Secondary | ICD-10-CM | POA: Diagnosis not present

## 2021-12-01 DIAGNOSIS — E6609 Other obesity due to excess calories: Secondary | ICD-10-CM | POA: Diagnosis not present

## 2021-12-01 DIAGNOSIS — I10 Essential (primary) hypertension: Secondary | ICD-10-CM | POA: Diagnosis not present

## 2021-12-01 DIAGNOSIS — M159 Polyosteoarthritis, unspecified: Secondary | ICD-10-CM | POA: Diagnosis not present

## 2021-12-11 DIAGNOSIS — E291 Testicular hypofunction: Secondary | ICD-10-CM | POA: Diagnosis not present

## 2022-01-13 DIAGNOSIS — E291 Testicular hypofunction: Secondary | ICD-10-CM | POA: Diagnosis not present

## 2022-02-27 DIAGNOSIS — M79604 Pain in right leg: Secondary | ICD-10-CM | POA: Diagnosis not present

## 2022-02-27 DIAGNOSIS — M159 Polyosteoarthritis, unspecified: Secondary | ICD-10-CM | POA: Diagnosis not present

## 2022-02-27 DIAGNOSIS — I10 Essential (primary) hypertension: Secondary | ICD-10-CM | POA: Diagnosis not present

## 2022-02-27 DIAGNOSIS — I872 Venous insufficiency (chronic) (peripheral): Secondary | ICD-10-CM | POA: Diagnosis not present

## 2022-02-27 DIAGNOSIS — E6609 Other obesity due to excess calories: Secondary | ICD-10-CM | POA: Diagnosis not present

## 2022-02-27 DIAGNOSIS — Z6838 Body mass index (BMI) 38.0-38.9, adult: Secondary | ICD-10-CM | POA: Diagnosis not present

## 2022-02-27 DIAGNOSIS — E291 Testicular hypofunction: Secondary | ICD-10-CM | POA: Diagnosis not present

## 2022-02-27 DIAGNOSIS — G894 Chronic pain syndrome: Secondary | ICD-10-CM | POA: Diagnosis not present

## 2022-03-03 ENCOUNTER — Encounter (HOSPITAL_COMMUNITY): Payer: Self-pay | Admitting: Internal Medicine

## 2022-03-04 ENCOUNTER — Other Ambulatory Visit (HOSPITAL_COMMUNITY): Payer: Self-pay | Admitting: Internal Medicine

## 2022-03-04 DIAGNOSIS — M79604 Pain in right leg: Secondary | ICD-10-CM

## 2022-03-11 ENCOUNTER — Ambulatory Visit (HOSPITAL_COMMUNITY)
Admission: RE | Admit: 2022-03-11 | Discharge: 2022-03-11 | Disposition: A | Discharge status: Home / Self Care | Payer: PPO | Source: Ambulatory Visit | Attending: Internal Medicine | Admitting: Internal Medicine

## 2022-03-11 DIAGNOSIS — M79604 Pain in right leg: Secondary | ICD-10-CM

## 2022-03-31 DIAGNOSIS — E291 Testicular hypofunction: Secondary | ICD-10-CM | POA: Diagnosis not present

## 2022-04-10 DIAGNOSIS — H3540 Unspecified peripheral retinal degeneration: Secondary | ICD-10-CM | POA: Diagnosis not present

## 2022-04-10 DIAGNOSIS — H524 Presbyopia: Secondary | ICD-10-CM | POA: Diagnosis not present

## 2022-05-04 ENCOUNTER — Other Ambulatory Visit (HOSPITAL_COMMUNITY): Payer: Self-pay | Admitting: Internal Medicine

## 2022-05-04 ENCOUNTER — Ambulatory Visit (HOSPITAL_COMMUNITY)
Admission: RE | Admit: 2022-05-04 | Discharge: 2022-05-04 | Disposition: A | Discharge status: Home / Self Care | Payer: PPO | Source: Ambulatory Visit | Attending: Internal Medicine | Admitting: Internal Medicine

## 2022-05-04 DIAGNOSIS — Z0001 Encounter for general adult medical examination with abnormal findings: Secondary | ICD-10-CM | POA: Diagnosis not present

## 2022-05-04 DIAGNOSIS — M79672 Pain in left foot: Secondary | ICD-10-CM

## 2022-05-04 DIAGNOSIS — G894 Chronic pain syndrome: Secondary | ICD-10-CM | POA: Diagnosis not present

## 2022-05-04 DIAGNOSIS — I872 Venous insufficiency (chronic) (peripheral): Secondary | ICD-10-CM | POA: Diagnosis not present

## 2022-05-04 DIAGNOSIS — M159 Polyosteoarthritis, unspecified: Secondary | ICD-10-CM | POA: Diagnosis not present

## 2022-05-04 DIAGNOSIS — Z1331 Encounter for screening for depression: Secondary | ICD-10-CM | POA: Diagnosis not present

## 2022-05-04 DIAGNOSIS — Z6838 Body mass index (BMI) 38.0-38.9, adult: Secondary | ICD-10-CM | POA: Diagnosis not present

## 2022-05-04 DIAGNOSIS — N400 Enlarged prostate without lower urinary tract symptoms: Secondary | ICD-10-CM | POA: Diagnosis not present

## 2022-05-04 DIAGNOSIS — I10 Essential (primary) hypertension: Secondary | ICD-10-CM | POA: Diagnosis not present

## 2022-05-04 DIAGNOSIS — E291 Testicular hypofunction: Secondary | ICD-10-CM | POA: Diagnosis not present

## 2022-05-12 ENCOUNTER — Ambulatory Visit (INDEPENDENT_AMBULATORY_CARE_PROVIDER_SITE_OTHER): Payer: PPO | Admitting: Podiatry

## 2022-05-12 DIAGNOSIS — M778 Other enthesopathies, not elsewhere classified: Secondary | ICD-10-CM | POA: Diagnosis not present

## 2022-05-12 DIAGNOSIS — M67472 Ganglion, left ankle and foot: Secondary | ICD-10-CM | POA: Diagnosis not present

## 2022-05-12 MED ORDER — TRIAMCINOLONE ACETONIDE 40 MG/ML IJ SUSP
20.0000 mg | Freq: Once | INTRAMUSCULAR | Status: AC
  Administered 2022-05-12: 20 mg

## 2022-05-12 NOTE — Progress Notes (Signed)
Subjective:  Patient ID: [name], male    DOB: 1950/07/22,  MRN: [id number] HPI Chief Complaint  Patient presents with   Foot Problem    Left foot 2nd toe, bone spur, started 3 weeks ago, patient had swelling, redness ,X-rays done last week     72 y.o. male presents with the above complaint.   ROS: Denies fever chills nausea vomiting muscle aches pains calf pain back pain chest pain shortness of breath.  Woke up 1 morning with an area of erythema and tenderness overlying the second metatarsal phalangeal joint area with a bulbous mass that was expressed and moved up into his toe with palpation from the patient.  He states that he can feel it move underneath the medial aspect of the toe and onto the plantar surface distally.  He states that I feel like I am walking on wadded up sock as he refers to the area beneath the second metatarsal phalangeal joint.  Past Medical History:  Diagnosis Date   Abnormal nuclear stress test 05/28/2011   Angina at rest [facility]) 05/28/2011   Arrhythmia    pt stated "it's ok cause no one is concerned about it"   Arthritis    Chest pain    Coronary artery disease    Depression    Dyslipidemia 05/28/2011   Hypertension    Smoker    Tobacco abuse 05/28/2011   Past Surgical History:  Procedure Laterality Date   CARDIAC CATHETERIZATION     CERVICAL [place] SURGERY     CORONARY ARTERY BYPASS GRAFT  05/27/2011   Procedure: OFF PUMP CORONARY ARTERY BYPASS GRAFTING (CABG);  Surgeon: Melrose Nakayama, MD;  Location: [place];  Service: Open Heart Surgery;  Laterality: N/A;   KNEE ARTHROSCOPY W/ DEBRIDEMENT     LEFT HEART CATHETERIZATION WITH CORONARY ANGIOGRAM N/A 05/26/2011   Procedure: LEFT HEART CATHETERIZATION WITH CORONARY ANGIOGRAM;  Surgeon: Pixie Casino, MD;  Location: [facility] CATH LAB;  Service: Cardiovascular;  Laterality: N/A;   OTHER SURGICAL HISTORY  2018   shoulder (right)     Current Outpatient Medications:    aspirin EC 81 MG tablet, Take  81 mg by mouth daily. Swallow whole., Disp: , Rfl:    atorvastatin (LIPITOR) 80 MG tablet, Take 1 tablet (80 mg total) by mouth daily. Patient has been given multiple refills with note to call and schedule an appointment. Must call the office and schedule for further refills to be granted, Disp: 7 tablet, Rfl: 0   furosemide (LASIX) 20 MG tablet, Take 20 mg by mouth daily as needed., Disp: , Rfl:    ibuprofen (ADVIL,MOTRIN) 800 MG tablet, Take 800 mg by mouth daily., Disp: , Rfl:    losartan (COZAAR) 25 MG tablet, Take 1 tablet (25 mg total) by mouth daily., Disp: 90 tablet, Rfl: 3   metoprolol succinate (TOPROL-XL) 25 MG 24 hr tablet, Take 0.5 tablets (12.5 mg total) by mouth daily. Take with or immediately following a meal., Disp: 45 tablet, Rfl: 3   oxyCODONE (ROXICODONE) 15 MG immediate release tablet, Take 15 mg by mouth every 4 (four) hours as needed for pain., Disp: , Rfl:    pantoprazole (PROTONIX) 40 MG tablet, TAKE 1 TABLET(40 MG) BY MOUTH DAILY (Patient not taking: Reported on 06/24/2021), Disp: 30 tablet, Rfl: 0   tamsulosin (FLOMAX) 0.4 MG CAPS capsule, Take 0.4 mg by mouth daily., Disp: , Rfl:   Allergies  Allergen Reactions   Penicillins Other (See Comments)    unknown  Ramipril Cough   Review of Systems Objective:  There were no vitals filed for this visit.  General: Well developed, nourished, in no acute distress, alert and oriented x3   Dermatological: Skin is warm, dry and supple bilateral. Nails x 10 are well maintained; remaining integument appears unremarkable at this time. There are no open sores, no preulcerative lesions, no rash or signs of infection present.  Vascular: Dorsalis Pedis artery and Posterior Tibial artery pedal pulses are 2/4 bilateral with immedate capillary fill time. Pedal hair growth present. No varicosities and no lower extremity edema present bilateral.   Neruologic: Grossly intact via light touch bilateral. Vibratory intact via tuning fork  bilateral. Protective threshold with Semmes Wienstein monofilament intact to all pedal sites bilateral. Patellar and Achilles deep tendon reflexes 2+ bilateral. No Babinski or clonus noted bilateral.   Musculoskeletal: No gross boney pedal deformities bilateral. No pain, crepitus, or limitation noted with foot and ankle range of motion bilateral. Muscular strength 5/5 in all groups tested bilateral.  He has pain on end range of motion of the second metatarsophalangeal joint as well as pain on palpation of the plantar and plantar medial aspect of the metatarsophalangeal joint second and third.  Gait: Unassisted, Nonantalgic.    Radiographs:  Radiographs previously taken were reviewed today not demonstrating any type of osseous abnormalities other than osteoarthritic changes of that toe.  Assessment & Plan:   Assessment: Probable ganglion cyst with capsulitis second and third metatarsophalangeal joint left.  Plan: I injected the second interdigital space today 10 mg Kenalog 5 mg Marcaine point maximal tenderness.  I recommended that he follow-up with [place] immediately should this recur.  We also discussed appropriate shoe gear stretching exercise ice therapy sugar modifications.     Brookes Craine T. [place], [place]

## 2022-06-02 ENCOUNTER — Other Ambulatory Visit: Payer: Self-pay | Admitting: Cardiology

## 2022-06-12 DIAGNOSIS — E291 Testicular hypofunction: Secondary | ICD-10-CM | POA: Diagnosis not present

## 2022-07-16 DIAGNOSIS — E291 Testicular hypofunction: Secondary | ICD-10-CM | POA: Diagnosis not present

## 2022-08-03 DIAGNOSIS — Z6837 Body mass index (BMI) 37.0-37.9, adult: Secondary | ICD-10-CM | POA: Diagnosis not present

## 2022-08-03 DIAGNOSIS — M159 Polyosteoarthritis, unspecified: Secondary | ICD-10-CM | POA: Diagnosis not present

## 2022-08-03 DIAGNOSIS — G894 Chronic pain syndrome: Secondary | ICD-10-CM | POA: Diagnosis not present

## 2022-08-03 DIAGNOSIS — N400 Enlarged prostate without lower urinary tract symptoms: Secondary | ICD-10-CM | POA: Diagnosis not present

## 2022-08-03 DIAGNOSIS — E6609 Other obesity due to excess calories: Secondary | ICD-10-CM | POA: Diagnosis not present

## 2022-08-03 DIAGNOSIS — I10 Essential (primary) hypertension: Secondary | ICD-10-CM | POA: Diagnosis not present

## 2022-08-17 DIAGNOSIS — E291 Testicular hypofunction: Secondary | ICD-10-CM | POA: Diagnosis not present

## 2022-09-15 DIAGNOSIS — E291 Testicular hypofunction: Secondary | ICD-10-CM | POA: Diagnosis not present

## 2022-10-16 DIAGNOSIS — E291 Testicular hypofunction: Secondary | ICD-10-CM | POA: Diagnosis not present

## 2022-11-03 DIAGNOSIS — I872 Venous insufficiency (chronic) (peripheral): Secondary | ICD-10-CM | POA: Diagnosis not present

## 2022-11-03 DIAGNOSIS — I10 Essential (primary) hypertension: Secondary | ICD-10-CM | POA: Diagnosis not present

## 2022-11-03 DIAGNOSIS — E6609 Other obesity due to excess calories: Secondary | ICD-10-CM | POA: Diagnosis not present

## 2022-11-03 DIAGNOSIS — M159 Polyosteoarthritis, unspecified: Secondary | ICD-10-CM | POA: Diagnosis not present

## 2022-11-03 DIAGNOSIS — Z6838 Body mass index (BMI) 38.0-38.9, adult: Secondary | ICD-10-CM | POA: Diagnosis not present

## 2022-11-03 DIAGNOSIS — M5136 Other intervertebral disc degeneration, lumbar region: Secondary | ICD-10-CM | POA: Diagnosis not present

## 2022-11-03 DIAGNOSIS — G894 Chronic pain syndrome: Secondary | ICD-10-CM | POA: Diagnosis not present

## 2022-11-03 DIAGNOSIS — N400 Enlarged prostate without lower urinary tract symptoms: Secondary | ICD-10-CM | POA: Diagnosis not present

## 2022-11-16 DIAGNOSIS — Z23 Encounter for immunization: Secondary | ICD-10-CM | POA: Diagnosis not present

## 2022-11-16 DIAGNOSIS — E291 Testicular hypofunction: Secondary | ICD-10-CM | POA: Diagnosis not present

## 2022-11-29 ENCOUNTER — Other Ambulatory Visit: Payer: Self-pay | Admitting: Cardiology

## 2022-12-10 ENCOUNTER — Ambulatory Visit: Payer: PPO | Admitting: Cardiology

## 2022-12-16 DIAGNOSIS — E291 Testicular hypofunction: Secondary | ICD-10-CM | POA: Diagnosis not present

## 2023-01-05 ENCOUNTER — Ambulatory Visit: Payer: PPO | Attending: Cardiology | Admitting: Nurse Practitioner

## 2023-01-05 ENCOUNTER — Encounter: Payer: Self-pay | Admitting: Nurse Practitioner

## 2023-01-05 VITALS — BP 134/92 | HR 59 | Ht 65.0 in | Wt 222.0 lb

## 2023-01-05 DIAGNOSIS — I6523 Occlusion and stenosis of bilateral carotid arteries: Secondary | ICD-10-CM | POA: Diagnosis not present

## 2023-01-05 DIAGNOSIS — E785 Hyperlipidemia, unspecified: Secondary | ICD-10-CM | POA: Diagnosis not present

## 2023-01-05 DIAGNOSIS — I2510 Atherosclerotic heart disease of native coronary artery without angina pectoris: Secondary | ICD-10-CM

## 2023-01-05 DIAGNOSIS — I10 Essential (primary) hypertension: Secondary | ICD-10-CM

## 2023-01-05 DIAGNOSIS — R600 Localized edema: Secondary | ICD-10-CM

## 2023-01-05 NOTE — Patient Instructions (Addendum)

## 2023-01-05 NOTE — Progress Notes (Signed)
Cardiology Office Note:  .   Date:  01/05/2023 ID:  [name], DOB 1950/07/07, MRN [id number] PCP: Elfredia Nevins, MD  [facility] Providers Cardiologist:  Dina Rich, MD    History of Present Illness: Marland Kitchen   [name] is a 72 y.o. male with a PMH of CAD, s/p CABG in 2013, HTN, HLD, carotid artery stenosis, who presents today for overdue 1 year follow-up.   Last seen by Dr. Dina Rich on June 24, 2021. BP was elevated in office, but home numbers were at goal. Was restarted on Losartan 25 mg daily and Toprol XL12.5 mg daily.   Today he presents for overdue 1 year follow-up. He admits to recent leg swelling after hurting his back. Taking Lasix PRN for this. Denies any chest pain, shortness of breath, palpitations, syncope, presyncope, dizziness, orthopnea, PND, significant weight changes, acute bleeding, or claudication.  ROS: Negative. See HPI.   Studies Reviewed: Marland Kitchen    EKG:  EKG Interpretation Date/Time:  Tuesday January 05 2023 14:51:44 EST Ventricular Rate:  74 PR Interval:  156 QRS Duration:  86 QT Interval:  354 QTC Calculation: 392 R Axis:   81  Text Interpretation: Sinus rhythm with marked sinus arrhythmia with Premature atrial complexes When compared with ECG of 28-May-2011 07:59, Questionable change in QRS axis ST no longer elevated in Inferior leads ST no longer elevated in Lateral leads Nonspecific T wave abnormality, improved in Lateral leads Confirmed by Sharlene Dory [phone]) on 01/05/2023 3:05:17 PM   Carotid duplex 07/2021:  Summary:  Right Carotid: Velocities in the right ICA are consistent with a 1-39%  stenosis.   Left Carotid: Velocities in the left ICA are consistent with a 1-39%  stenosis.   Vertebrals: Bilateral vertebral arteries demonstrate antegrade flow.  Subclavians: Normal flow hemodynamics were seen in bilateral subclavian arteries.  Cardiac Cath in 2013:  05/2011 Cath Hemodynamics:             Central Aortic  Pressure / Mean Aortic Pressure: 120/76             LV Pressure / LV End diastolic Pressure:  20   Left Ventriculography:             EF:  60-65%             Wall Motion: No focal wall motion abnormalities   Coronary Angiographic Data: Left Main:  Short left main. Distal 20% stenosis into the ostial LAD. Left Anterior Descending (LAD):  There is a 90% ostial discrete lesion of the LAD. There is mild distal LAD disease. 1st diagonal (D1):  Large vessel without significant stenosis. Circumflex (LCx):  Dominant vessel. There is a moderate stenosis of the LCX up to 50% distal to OM3. 1st obtuse marginal:  Small vessel, high takeoff. No significant disease. 2nd obtuse marginal: Small vessel, no significant disease 3rd obtuse marginal:  Large vessel which branches into 2 vessels supplying a good portion of the lateral wall. No significant stenoses.       posterior lateral branch:  Supplies the septum. Right Coronary Artery: Small to moderate-sized non-dominant vessel, no significant stenoses.   Impression: 1.  High risk ostial LAD discrete stenosis (90%). 2.  Moderate mid-distal LCx stenosis. Left dominant system. 3.  LVEF >60%, no focal wall motion abnormalities.   Plan: 1.  Discussed case with Dr. Allyson Sabal and Dr. Alanda Amass. Stent would be high risk. Will ask CT surgery to evaluate for possible OPCABG. 2.  Will admit due to high risk  anatomy with hopefully surgical intervention in the next few days. He has not been on dual antiplatelets.   The case and results was discussed with the patient and family. The case and results was not discussed with the patient's PCP. The case and results was discussed with the patient's Cardiologist.  Echo 05/2011: LVEF > 55%.  Mild mitral valve regurgitation.  Mildly sclerotic aortic valve without stenosis, no other significant valvular abnormalities.  Physical Exam:   VS:  BP (!) 134/92   Pulse (!) 59   Ht 5\' 5"  (1.651 m)   Wt 222 lb (100.7 kg)   SpO2  95%   BMI 36.94 kg/m    Wt Readings from Last 3 Encounters:  01/05/23 222 lb (100.7 kg)  06/24/21 216 lb 12.8 oz (98.3 kg)  09/08/19 215 lb (97.5 kg)    GEN: Obese, 72 year old male in no acute distress NECK: No JVD; No carotid bruits CARDIAC: S1/S2, RRR, no murmurs, rubs, gallops RESPIRATORY:  Clear to auscultation without rales, wheezing or rhonchi  ABDOMEN: Soft, non-tender, non-distended EXTREMITIES: 2+ edema to right lower extremity, 1+ edema to left lower extremity; No deformity   ASSESSMENT AND PLAN: .    Leg edema Noticed recently after his back injury.  Appears he has some venous insufficiency that is causing this.  See physical exam findings above.  Offered/suggested to increase Lasix, patient politely declines.  Continue Lasix 20 mg daily as needed.  No suspicious findings of DVT on exam.  Doppler 03/2022 was negative for DVT. Discussed concerning signs/symptoms of DVT-he verbalized understanding and will let us know if this occurs. No medication changes at this time.  Recommended low-salt heart healthy diet and leg elevation as needed.  He verbalized understanding. Care and ED precautions discussed.   CAD, s/p CABG Stable with no anginal symptoms. No indication for ischemic evaluation.  Continue aspirin, atorvastatin, losartan, and Toprol-XL. Heart healthy diet and regular cardiovascular exercise encouraged.   Carotid artery stenosis Carotid duplex in 2023 revealed 1 to 39% bilateral ICA stenosis.  Denies any symptoms.  Continue current medication regimen. Heart healthy diet and regular cardiovascular exercise encouraged.   HLD LDL 51 05/2022.  LDL is at goal.  No medication changes at this time. Heart healthy diet and regular cardiovascular exercise encouraged.   HTN Blood pressure stable and at goal. Discussed to monitor BP at home at least 2 hours after medications and sitting for 5-10 minutes.  Continue current medication regimen. Heart healthy diet and regular  cardiovascular exercise encouraged.   Dispo: Follow-up with Dr. Dina Rich or APP in 1 year or sooner if anything changes.  Signed, Sharlene Dory, NP

## 2023-01-18 DIAGNOSIS — N400 Enlarged prostate without lower urinary tract symptoms: Secondary | ICD-10-CM | POA: Diagnosis not present

## 2023-01-18 DIAGNOSIS — M159 Polyosteoarthritis, unspecified: Secondary | ICD-10-CM | POA: Diagnosis not present

## 2023-01-18 DIAGNOSIS — G894 Chronic pain syndrome: Secondary | ICD-10-CM | POA: Diagnosis not present

## 2023-01-18 DIAGNOSIS — I10 Essential (primary) hypertension: Secondary | ICD-10-CM | POA: Diagnosis not present

## 2023-02-27 ENCOUNTER — Other Ambulatory Visit: Payer: Self-pay | Admitting: Cardiology

## 2023-03-15 ENCOUNTER — Encounter (HOSPITAL_COMMUNITY): Payer: Self-pay | Admitting: Internal Medicine

## 2023-03-15 ENCOUNTER — Other Ambulatory Visit (HOSPITAL_COMMUNITY): Payer: Self-pay | Admitting: Internal Medicine

## 2023-03-15 DIAGNOSIS — D518 Other vitamin B12 deficiency anemias: Secondary | ICD-10-CM | POA: Diagnosis not present

## 2023-03-15 DIAGNOSIS — Z136 Encounter for screening for cardiovascular disorders: Secondary | ICD-10-CM

## 2023-03-15 DIAGNOSIS — I2089 Other forms of angina pectoris: Secondary | ICD-10-CM | POA: Diagnosis not present

## 2023-03-15 DIAGNOSIS — I2510 Atherosclerotic heart disease of native coronary artery without angina pectoris: Secondary | ICD-10-CM | POA: Diagnosis not present

## 2023-03-15 DIAGNOSIS — G894 Chronic pain syndrome: Secondary | ICD-10-CM | POA: Diagnosis not present

## 2023-03-15 DIAGNOSIS — Z6838 Body mass index (BMI) 38.0-38.9, adult: Secondary | ICD-10-CM | POA: Diagnosis not present

## 2023-03-15 DIAGNOSIS — Z1331 Encounter for screening for depression: Secondary | ICD-10-CM | POA: Diagnosis not present

## 2023-03-15 DIAGNOSIS — M1991 Primary osteoarthritis, unspecified site: Secondary | ICD-10-CM | POA: Diagnosis not present

## 2023-03-15 DIAGNOSIS — K219 Gastro-esophageal reflux disease without esophagitis: Secondary | ICD-10-CM | POA: Diagnosis not present

## 2023-03-15 DIAGNOSIS — Z125 Encounter for screening for malignant neoplasm of prostate: Secondary | ICD-10-CM | POA: Diagnosis not present

## 2023-03-15 DIAGNOSIS — Z0001 Encounter for general adult medical examination with abnormal findings: Secondary | ICD-10-CM | POA: Diagnosis not present

## 2023-03-15 DIAGNOSIS — I10 Essential (primary) hypertension: Secondary | ICD-10-CM | POA: Diagnosis not present

## 2023-03-15 DIAGNOSIS — N400 Enlarged prostate without lower urinary tract symptoms: Secondary | ICD-10-CM | POA: Diagnosis not present

## 2023-03-15 DIAGNOSIS — E6609 Other obesity due to excess calories: Secondary | ICD-10-CM | POA: Diagnosis not present

## 2023-03-15 DIAGNOSIS — E559 Vitamin D deficiency, unspecified: Secondary | ICD-10-CM | POA: Diagnosis not present

## 2023-03-15 DIAGNOSIS — G9332 Myalgic encephalomyelitis/chronic fatigue syndrome: Secondary | ICD-10-CM | POA: Diagnosis not present

## 2023-03-18 ENCOUNTER — Ambulatory Visit (HOSPITAL_COMMUNITY)
Admission: RE | Admit: 2023-03-18 | Discharge: 2023-03-18 | Disposition: A | Discharge status: Home / Self Care | Payer: PPO | Source: Ambulatory Visit | Attending: Internal Medicine | Admitting: Internal Medicine

## 2023-03-18 DIAGNOSIS — Z136 Encounter for screening for cardiovascular disorders: Secondary | ICD-10-CM | POA: Diagnosis not present

## 2023-03-18 DIAGNOSIS — I6523 Occlusion and stenosis of bilateral carotid arteries: Secondary | ICD-10-CM | POA: Diagnosis not present

## 2023-03-18 DIAGNOSIS — E785 Hyperlipidemia, unspecified: Secondary | ICD-10-CM | POA: Diagnosis not present

## 2023-03-18 DIAGNOSIS — I10 Essential (primary) hypertension: Secondary | ICD-10-CM | POA: Diagnosis not present

## 2023-04-12 DIAGNOSIS — H3540 Unspecified peripheral retinal degeneration: Secondary | ICD-10-CM | POA: Diagnosis not present

## 2023-04-12 DIAGNOSIS — H524 Presbyopia: Secondary | ICD-10-CM | POA: Diagnosis not present

## 2023-05-10 DIAGNOSIS — I10 Essential (primary) hypertension: Secondary | ICD-10-CM | POA: Diagnosis not present

## 2023-05-10 DIAGNOSIS — G894 Chronic pain syndrome: Secondary | ICD-10-CM | POA: Diagnosis not present

## 2023-05-10 DIAGNOSIS — M159 Polyosteoarthritis, unspecified: Secondary | ICD-10-CM | POA: Diagnosis not present

## 2023-05-29 ENCOUNTER — Other Ambulatory Visit: Payer: Self-pay | Admitting: Cardiology

## 2023-07-12 DIAGNOSIS — G9332 Myalgic encephalomyelitis/chronic fatigue syndrome: Secondary | ICD-10-CM | POA: Diagnosis not present

## 2023-07-12 DIAGNOSIS — I10 Essential (primary) hypertension: Secondary | ICD-10-CM | POA: Diagnosis not present

## 2023-07-12 DIAGNOSIS — D518 Other vitamin B12 deficiency anemias: Secondary | ICD-10-CM | POA: Diagnosis not present

## 2023-07-12 DIAGNOSIS — E6609 Other obesity due to excess calories: Secondary | ICD-10-CM | POA: Diagnosis not present

## 2023-07-12 DIAGNOSIS — Z125 Encounter for screening for malignant neoplasm of prostate: Secondary | ICD-10-CM | POA: Diagnosis not present

## 2023-07-12 DIAGNOSIS — Z6836 Body mass index (BMI) 36.0-36.9, adult: Secondary | ICD-10-CM | POA: Diagnosis not present

## 2023-07-12 DIAGNOSIS — E559 Vitamin D deficiency, unspecified: Secondary | ICD-10-CM | POA: Diagnosis not present

## 2023-07-12 DIAGNOSIS — I872 Venous insufficiency (chronic) (peripheral): Secondary | ICD-10-CM | POA: Diagnosis not present

## 2023-07-12 DIAGNOSIS — M79672 Pain in left foot: Secondary | ICD-10-CM | POA: Diagnosis not present

## 2023-07-12 DIAGNOSIS — Z136 Encounter for screening for cardiovascular disorders: Secondary | ICD-10-CM | POA: Diagnosis not present

## 2023-07-12 DIAGNOSIS — I2089 Other forms of angina pectoris: Secondary | ICD-10-CM | POA: Diagnosis not present

## 2023-08-27 ENCOUNTER — Other Ambulatory Visit: Payer: Self-pay | Admitting: Cardiology

## 2023-09-01 DIAGNOSIS — G894 Chronic pain syndrome: Secondary | ICD-10-CM | POA: Diagnosis not present

## 2023-09-01 DIAGNOSIS — M51369 Other intervertebral disc degeneration, lumbar region without mention of lumbar back pain or lower extremity pain: Secondary | ICD-10-CM | POA: Diagnosis not present

## 2023-09-01 DIAGNOSIS — M1991 Primary osteoarthritis, unspecified site: Secondary | ICD-10-CM | POA: Diagnosis not present

## 2023-09-01 DIAGNOSIS — I10 Essential (primary) hypertension: Secondary | ICD-10-CM | POA: Diagnosis not present

## 2023-11-05 DIAGNOSIS — N400 Enlarged prostate without lower urinary tract symptoms: Secondary | ICD-10-CM | POA: Diagnosis not present

## 2023-11-05 DIAGNOSIS — I10 Essential (primary) hypertension: Secondary | ICD-10-CM | POA: Diagnosis not present

## 2023-11-05 DIAGNOSIS — M51369 Other intervertebral disc degeneration, lumbar region without mention of lumbar back pain or lower extremity pain: Secondary | ICD-10-CM | POA: Diagnosis not present

## 2023-11-05 DIAGNOSIS — E6609 Other obesity due to excess calories: Secondary | ICD-10-CM | POA: Diagnosis not present

## 2023-11-05 DIAGNOSIS — G894 Chronic pain syndrome: Secondary | ICD-10-CM | POA: Diagnosis not present

## 2023-11-05 DIAGNOSIS — Z6836 Body mass index (BMI) 36.0-36.9, adult: Secondary | ICD-10-CM | POA: Diagnosis not present

## 2023-11-05 DIAGNOSIS — M5136 Other intervertebral disc degeneration, lumbar region with discogenic back pain only: Secondary | ICD-10-CM | POA: Diagnosis not present

## 2023-11-25 ENCOUNTER — Other Ambulatory Visit: Payer: Self-pay | Admitting: Cardiology

## 2023-11-27 ENCOUNTER — Other Ambulatory Visit: Payer: Self-pay | Admitting: Cardiology

## 2023-12-23 ENCOUNTER — Ambulatory Visit: Admitting: Nurse Practitioner

## 2023-12-31 ENCOUNTER — Ambulatory Visit: Admitting: Nurse Practitioner

## 2024-01-17 DIAGNOSIS — N401 Enlarged prostate with lower urinary tract symptoms: Secondary | ICD-10-CM | POA: Diagnosis not present

## 2024-02-23 ENCOUNTER — Other Ambulatory Visit: Payer: Self-pay | Admitting: Cardiology

## 2024-02-23 ENCOUNTER — Other Ambulatory Visit: Payer: Self-pay | Admitting: Nurse Practitioner

## 2024-03-20 ENCOUNTER — Ambulatory Visit: Attending: Cardiology | Admitting: Cardiology

## 2024-03-20 ENCOUNTER — Encounter: Payer: Self-pay | Admitting: Cardiology

## 2024-03-20 VITALS — BP 130/78 | HR 73 | Ht 64.0 in | Wt 218.4 lb

## 2024-03-20 DIAGNOSIS — E782 Mixed hyperlipidemia: Secondary | ICD-10-CM | POA: Diagnosis not present

## 2024-03-20 DIAGNOSIS — I10 Essential (primary) hypertension: Secondary | ICD-10-CM

## 2024-03-20 DIAGNOSIS — I2510 Atherosclerotic heart disease of native coronary artery without angina pectoris: Secondary | ICD-10-CM | POA: Diagnosis not present

## 2024-03-20 MED ORDER — ATORVASTATIN CALCIUM 80 MG PO TABS: 80.0000 mg | ORAL_TABLET | Freq: Every day | ORAL | 3 refills | Status: AC

## 2024-03-20 NOTE — Progress Notes (Signed)
 "     Clinical Summary Mr. Mcgurk is a 74 y.o.male seen today for follow up of the following medical problems.    1.CAD - hx of CABG 05/2011 (LIMA-LAD) - echo 05/2011 LVEF >55%, aortic sclerosis, mild MR,      - no chest pains, no SOB/DOE - compliant with meds       2. Hyperlipidemia - Jan 2025 TC 126 TG 54 HDL 54 LDL 60 - ran out statin few weeks ago   3. HTN - compliant with meds - home bp's 120s/60-70s   4. Carotid stenosis 07/2018 carotid mild bilateral disease  Jan 2025 minimal plaque     5. AAA screen - 05/2016 AAA US  normal  Past Medical History:  Diagnosis Date   Abnormal nuclear stress test 05/28/2011   Angina at rest 05/28/2011   Arrhythmia    pt stated it's ok cause no one is concerned about it   Arthritis    Chest pain    Coronary artery disease    Depression    Dyslipidemia 05/28/2011   Hypertension    Smoker    Tobacco abuse 05/28/2011     Allergies[1]   Current Outpatient Medications  Medication Sig Dispense Refill   aspirin  EC 81 MG tablet Take 81 mg by mouth daily. Swallow whole.     furosemide  (LASIX ) 20 MG tablet Take 20 mg by mouth daily as needed.     ibuprofen (ADVIL,MOTRIN) 800 MG tablet Take 800 mg by mouth daily.     losartan  (COZAAR ) 25 MG tablet Take 1 tablet (25 mg total) by mouth daily. Please keep scheduled appointment for future refills. Thank you. 90 tablet 0   metoprolol  succinate (TOPROL -XL) 25 MG 24 hr tablet TAKE 1/2 TABLET BY MOUTH DAILY WITH OR IMMEDIATELY FOLLOWING A MEAL. Please keep scheduled appointment for future refills. Thank you. 45 tablet 0   pantoprazole  (PROTONIX ) 40 MG tablet TAKE 1 TABLET(40 MG) BY MOUTH DAILY 30 tablet 0   tamsulosin (FLOMAX) 0.4 MG CAPS capsule Take 0.4 mg by mouth daily.     atorvastatin  (LIPITOR) 80 MG tablet Take 1 tablet (80 mg total) by mouth daily. 90 tablet 3   No current facility-administered medications for this visit.     Past Surgical History:  Procedure Laterality Date    CARDIAC CATHETERIZATION     CERVICAL DISC SURGERY     CORONARY ARTERY BYPASS GRAFT  05/27/2011   Procedure: OFF PUMP CORONARY ARTERY BYPASS GRAFTING (CABG);  Surgeon: Elspeth JAYSON Millers, MD;  Location: [facility];  Service: Open Heart Surgery;  Laterality: N/A;   KNEE ARTHROSCOPY W/ DEBRIDEMENT     LEFT HEART CATHETERIZATION WITH CORONARY ANGIOGRAM N/A 05/26/2011   Procedure: LEFT HEART CATHETERIZATION WITH CORONARY ANGIOGRAM;  Surgeon: Vinie KYM Maxcy, MD;  Location[facility] C[facility];  Service: Cardiovascular;  Laterality: N/A;   OTHER SURGICAL HISTORY  2018   shoulder (right)      Allergies[2]    Family History  Problem Relation Age of Onset   Stroke Father    Hypertension Mother    Kidney disease Mother    Heart disease Brother    Kidney disease Brother        transplant   Valvular heart disease Unknown 70       congenital/had sugery for mechanical valve installed     Social History Mr. Stahl reports that he has been smoking cigarettes. He started smoking about 52 years ago. He has a 52.5 pack-year smoking history. He has  never used smokeless tobacco. Mr. Vicuna reports no history of alcohol use.   Physical Examination Today's Vitals   03/20/24 1429 03/20/24 1505  BP: (!) 144/63 130/78  Pulse: 73   SpO2: 97%   Weight: 218 lb 6.4 oz (99.1 kg)   Height: 5' 4 (1.626 m)    Body mass index is 37.49 kg/m.  Gen: resting comfortably, no acute distress HEENT: no scleral icterus, pupils equal round and reactive, no palptable cervical adenopathy,  CV: RRR, no mrg, no jvd Resp: Clear to auscultation bilaterally GI: abdomen is soft, non-tender, non-distended, normal bowel sounds, no hepatosplenomegaly MSK: extremities are warm, no edema.  Skin: warm, no rash Neuro:  no focal deficits Psych: appropriate affect   Diagnostic Studies 05/2011 Cath Hemodynamics:             Central Aortic Pressure / Mean Aortic Pressure: 120/76             LV Pressure / LV End  diastolic Pressure:  20   Left Ventriculography:             EF:  60-65%             Wall Motion: No focal wall motion abnormalities   Coronary Angiographic Data: Left Main:  Short left main. Distal 20% stenosis into the ostial LAD. Left Anterior Descending (LAD):  There is a 90% ostial discrete lesion of the LAD. There is mild distal LAD disease. 1st diagonal (D1):  Large vessel without significant stenosis. Circumflex (LCx):  Dominant vessel. There is a moderate stenosis of the LCX up to 50% distal to OM3. 1st obtuse marginal:  Small vessel, high takeoff. No significant disease. 2nd obtuse marginal: Small vessel, no significant disease 3rd obtuse marginal:  Large vessel which branches into 2 vessels supplying a good portion of the lateral wall. No significant stenoses.       posterior lateral [employer]:  Supplies the septum. Right Coronary Artery: Small to moderate-sized non-dominant vessel, no significant stenoses.   Impression: 1.  High risk ostial LAD discrete stenosis (90%). 2.  Moderate mid-distal LCx stenosis. Left dominant system. 3.  LVEF >60%, no focal wall motion abnormalities.   Plan: 1.  Discussed case with Dr. Court and Dr. Maye. Stent would be high risk. Will ask CT surgery to evaluate for possible OPCABG. 2.  Will admit due to high risk anatomy with hopefully surgical intervention in the next few days. He has not been on dual antiplatelets.   The case and results was discussed with the patient and family. The case and results was not discussed with the patient's PCP. The case and results was discussed with the patient's Cardiologist.   04/2016 AAA US  No aneurysm   05/2016 carotid US  40-59% bilateral ICA stenosis    Assessment and Plan   1. CAD -no symptoms - EKG today shows NSR, no acute ischemic changes - continue current meds  2. HTN - at goal, continue current meds   3. Hyperlipidemia -ran out of statin few weeks ago, we will restart his  atorvastatin . Historically his cholesterol has been controlled    Dorn PHEBE Ross, M.D.     [1]  Allergies Allergen Reactions   Penicillins Other (See Comments)    unknown   Ramipril  Cough  [2]  Allergies Allergen Reactions   Penicillins Other (See Comments)    unknown   Ramipril  Cough   "

## 2024-03-20 NOTE — Patient Instructions (Signed)
 Medication Instructions:  Your physician recommends that you continue on your current medications as directed. Please refer to the Current Medication list given to you today.  *If you need a refill on your cardiac medications before your next appointment, please call your pharmacy*  Lab Work: None If you have labs (blood work) drawn today and your tests are completely normal, you will receive your results only by: MyChart Message (if you have MyChart) OR A paper copy in the mail If you have any lab test that is abnormal or we need to change your treatment, we will call you to review the results.  Testing/Procedures: None  Follow-Up: At [facility], you and your health needs are our priority.  As part of our continuing mission to provide you with exceptional heart care, our providers are all part of one team.  This team includes your primary Cardiologist (physician) and Advanced Practice Providers or APPs (Physician Assistants and Nurse Practitioners) who all work together to provide you with the care you need, when you need it.  Your next appointment:   1 year(s)  Provider:   You may see Alvan Carrier, MD or one of the following Advanced Practice Providers on your designated Care Team:   Laymon Qua, PA-C  [place], [place] Olivia [place], [place]     We recommend signing up for the patient portal called MyChart.  Sign up information is provided on this After Visit Summary.  MyChart is used to connect with patients for Virtual Visits (Telemedicine).  Patients are able to view lab/test results, encounter notes, upcoming appointments, etc.  Non-urgent messages can be sent to your provider as well.   To learn more about what you can do with MyChart, go to [email or web address].   Other Instructions Thank you for choosing [facility]!

## 2024-05-08 ENCOUNTER — Ambulatory Visit: Admitting: Nurse Practitioner

## 2024-05-10 ENCOUNTER — Ambulatory Visit: Admitting: Physician Assistant

## 2024-05-11 ENCOUNTER — Ambulatory Visit: Admitting: Physician Assistant
